# Patient Record
Sex: Female | Born: 1955
Health system: Southern US, Community
[De-identification: ages and names within clinical notes are randomized; demographics above are authoritative.]

## PROBLEM LIST (undated history)

## (undated) DIAGNOSIS — F419 Anxiety disorder, unspecified: Secondary | ICD-10-CM

## (undated) DIAGNOSIS — M1990 Unspecified osteoarthritis, unspecified site: Secondary | ICD-10-CM

## (undated) HISTORY — DX: Unspecified osteoarthritis, unspecified site: M19.90

## (undated) HISTORY — DX: Anxiety disorder, unspecified: F41.9

---

## 1997-07-23 ENCOUNTER — Other Ambulatory Visit: Admission: RE | Admit: 1997-07-23 | Discharge: 1997-07-23 | Payer: Self-pay | Admitting: Obstetrics and Gynecology

## 1998-09-06 ENCOUNTER — Other Ambulatory Visit: Admission: RE | Admit: 1998-09-06 | Discharge: 1998-09-06 | Payer: Self-pay | Admitting: *Deleted

## 1999-09-09 ENCOUNTER — Other Ambulatory Visit: Admission: RE | Admit: 1999-09-09 | Discharge: 1999-09-09 | Payer: Self-pay | Admitting: *Deleted

## 2000-11-21 ENCOUNTER — Other Ambulatory Visit: Admission: RE | Admit: 2000-11-21 | Discharge: 2000-11-21 | Payer: Self-pay | Admitting: Obstetrics and Gynecology

## 2000-12-17 ENCOUNTER — Encounter: Admission: RE | Admit: 2000-12-17 | Discharge: 2000-12-17 | Payer: Self-pay | Admitting: *Deleted

## 2000-12-17 ENCOUNTER — Encounter: Payer: Self-pay | Admitting: *Deleted

## 2002-01-21 ENCOUNTER — Encounter: Admission: RE | Admit: 2002-01-21 | Discharge: 2002-01-21 | Payer: Self-pay | Admitting: Obstetrics and Gynecology

## 2002-01-21 ENCOUNTER — Encounter: Payer: Self-pay | Admitting: Obstetrics and Gynecology

## 2002-02-03 ENCOUNTER — Other Ambulatory Visit: Admission: RE | Admit: 2002-02-03 | Discharge: 2002-02-03 | Payer: Self-pay | Admitting: Obstetrics and Gynecology

## 2003-02-04 ENCOUNTER — Encounter: Admission: RE | Admit: 2003-02-04 | Discharge: 2003-02-04 | Payer: Self-pay | Admitting: Obstetrics and Gynecology

## 2003-02-06 ENCOUNTER — Encounter: Admission: RE | Admit: 2003-02-06 | Discharge: 2003-02-06 | Payer: Self-pay | Admitting: Obstetrics and Gynecology

## 2003-02-10 ENCOUNTER — Other Ambulatory Visit: Admission: RE | Admit: 2003-02-10 | Discharge: 2003-02-10 | Payer: Self-pay | Admitting: Obstetrics and Gynecology

## 2004-02-09 ENCOUNTER — Ambulatory Visit (HOSPITAL_COMMUNITY): Admission: RE | Admit: 2004-02-09 | Discharge: 2004-02-09 | Payer: Self-pay | Admitting: Obstetrics and Gynecology

## 2005-04-07 ENCOUNTER — Encounter: Admission: RE | Admit: 2005-04-07 | Discharge: 2005-04-07 | Payer: Self-pay | Admitting: Obstetrics and Gynecology

## 2006-04-09 ENCOUNTER — Encounter: Admission: RE | Admit: 2006-04-09 | Discharge: 2006-04-09 | Payer: Self-pay | Admitting: Obstetrics and Gynecology

## 2007-05-14 ENCOUNTER — Encounter: Admission: RE | Admit: 2007-05-14 | Discharge: 2007-05-14 | Payer: Self-pay | Admitting: Obstetrics and Gynecology

## 2008-05-18 ENCOUNTER — Encounter: Admission: RE | Admit: 2008-05-18 | Discharge: 2008-05-18 | Payer: Self-pay | Admitting: Obstetrics and Gynecology

## 2009-06-04 ENCOUNTER — Encounter: Admission: RE | Admit: 2009-06-04 | Discharge: 2009-06-04 | Payer: Self-pay | Admitting: Obstetrics and Gynecology

## 2010-02-09 ENCOUNTER — Emergency Department (HOSPITAL_BASED_OUTPATIENT_CLINIC_OR_DEPARTMENT_OTHER)
Admission: EM | Admit: 2010-02-09 | Discharge: 2010-02-09 | Payer: Self-pay | Source: Home / Self Care | Admitting: Emergency Medicine

## 2010-02-14 LAB — BASIC METABOLIC PANEL
Calcium: 9.5 mg/dL (ref 8.4–10.5)
Chloride: 106 mEq/L (ref 96–112)
Creatinine, Ser: 0.6 mg/dL (ref 0.4–1.2)
GFR calc non Af Amer: >60 mL/min (ref >60)
Glucose, Bld: 121 mg/dL — ABNORMAL HIGH (ref 70–99)
Potassium: 4.6 mEq/L (ref 3.5–5.1)
Sodium: 141 mEq/L (ref 135–145)

## 2010-02-14 LAB — URINALYSIS, ROUTINE W REFLEX MICROSCOPIC
Nitrite: NEGATIVE
Protein, ur: NEGATIVE mg/dL
Specific Gravity, Urine: 1.015 (ref 1.005–1.030)
Urine Glucose, Fasting: NEGATIVE mg/dL
Urobilinogen, UA: 0.2 mg/dL (ref 0.0–1.0)
pH: 7.5 (ref 5.0–8.0)

## 2010-02-14 LAB — DIFFERENTIAL
Basophils Absolute: 0 10*3/uL (ref 0.0–0.1)
Eosinophils Absolute: 0 10*3/uL (ref 0.0–0.7)
Monocytes Absolute: 0.7 10*3/uL (ref 0.1–1.0)
Monocytes Relative: 5 % (ref 3–12)
Neutro Abs: 13 10*3/uL — ABNORMAL HIGH (ref 1.7–7.7)

## 2010-02-14 LAB — CBC: Hemoglobin: 16.2 g/dL — ABNORMAL HIGH (ref 12.0–15.0)

## 2010-05-09 ENCOUNTER — Other Ambulatory Visit: Payer: Self-pay | Admitting: Obstetrics and Gynecology

## 2010-05-09 DIAGNOSIS — Z1231 Encounter for screening mammogram for malignant neoplasm of breast: Secondary | ICD-10-CM

## 2010-06-07 ENCOUNTER — Ambulatory Visit
Admission: RE | Admit: 2010-06-07 | Discharge: 2010-06-07 | Disposition: A | Discharge status: Home / Self Care | Payer: Private Health Insurance - Indemnity | Source: Ambulatory Visit | Attending: Obstetrics and Gynecology | Admitting: Obstetrics and Gynecology

## 2010-06-07 DIAGNOSIS — Z1231 Encounter for screening mammogram for malignant neoplasm of breast: Secondary | ICD-10-CM

## 2010-08-11 ENCOUNTER — Other Ambulatory Visit: Payer: Self-pay | Admitting: Obstetrics and Gynecology

## 2010-08-11 DIAGNOSIS — Z780 Asymptomatic menopausal state: Secondary | ICD-10-CM

## 2010-08-26 ENCOUNTER — Ambulatory Visit
Admission: RE | Admit: 2010-08-26 | Discharge: 2010-08-26 | Disposition: A | Discharge status: Home / Self Care | Payer: Private Health Insurance - Indemnity | Source: Ambulatory Visit | Attending: Obstetrics and Gynecology | Admitting: Obstetrics and Gynecology

## 2010-08-26 DIAGNOSIS — Z780 Asymptomatic menopausal state: Secondary | ICD-10-CM

## 2011-05-01 ENCOUNTER — Other Ambulatory Visit: Payer: Self-pay | Admitting: Obstetrics and Gynecology

## 2011-05-01 DIAGNOSIS — Z1231 Encounter for screening mammogram for malignant neoplasm of breast: Secondary | ICD-10-CM

## 2011-06-08 ENCOUNTER — Ambulatory Visit
Admission: RE | Admit: 2011-06-08 | Discharge: 2011-06-08 | Disposition: A | Discharge status: Home / Self Care | Payer: Private Health Insurance - Indemnity | Source: Ambulatory Visit | Attending: Obstetrics and Gynecology | Admitting: Obstetrics and Gynecology

## 2011-06-08 DIAGNOSIS — Z1231 Encounter for screening mammogram for malignant neoplasm of breast: Secondary | ICD-10-CM

## 2012-06-04 ENCOUNTER — Other Ambulatory Visit: Payer: Self-pay

## 2012-06-04 DIAGNOSIS — Z1231 Encounter for screening mammogram for malignant neoplasm of breast: Secondary | ICD-10-CM

## 2012-07-09 ENCOUNTER — Ambulatory Visit
Admission: RE | Admit: 2012-07-09 | Discharge: 2012-07-09 | Disposition: A | Discharge status: Home / Self Care | Payer: BC Managed Care – PPO | Source: Ambulatory Visit

## 2012-07-09 DIAGNOSIS — Z1231 Encounter for screening mammogram for malignant neoplasm of breast: Secondary | ICD-10-CM

## 2012-11-29 ENCOUNTER — Other Ambulatory Visit: Payer: Self-pay | Admitting: Obstetrics and Gynecology

## 2012-11-29 DIAGNOSIS — M8580 Other specified disorders of bone density and structure, unspecified site: Secondary | ICD-10-CM

## 2012-12-23 ENCOUNTER — Ambulatory Visit
Admission: RE | Admit: 2012-12-23 | Discharge: 2012-12-23 | Disposition: A | Discharge status: Home / Self Care | Payer: BC Managed Care – PPO | Source: Ambulatory Visit | Attending: Obstetrics and Gynecology | Admitting: Obstetrics and Gynecology

## 2012-12-23 DIAGNOSIS — M8580 Other specified disorders of bone density and structure, unspecified site: Secondary | ICD-10-CM

## 2013-07-14 ENCOUNTER — Other Ambulatory Visit: Payer: Self-pay

## 2013-07-14 DIAGNOSIS — Z1231 Encounter for screening mammogram for malignant neoplasm of breast: Secondary | ICD-10-CM

## 2013-07-23 ENCOUNTER — Ambulatory Visit
Admission: RE | Admit: 2013-07-23 | Discharge: 2013-07-23 | Disposition: A | Discharge status: Home / Self Care | Payer: BC Managed Care – PPO | Source: Ambulatory Visit

## 2013-07-23 DIAGNOSIS — Z1231 Encounter for screening mammogram for malignant neoplasm of breast: Secondary | ICD-10-CM

## 2014-07-15 ENCOUNTER — Other Ambulatory Visit: Payer: Self-pay

## 2014-07-15 DIAGNOSIS — Z1231 Encounter for screening mammogram for malignant neoplasm of breast: Secondary | ICD-10-CM

## 2014-08-24 ENCOUNTER — Ambulatory Visit: Payer: Self-pay

## 2014-10-02 ENCOUNTER — Ambulatory Visit
Admission: RE | Admit: 2014-10-02 | Discharge: 2014-10-02 | Disposition: A | Discharge status: Home / Self Care | Payer: BLUE CROSS/BLUE SHIELD | Source: Ambulatory Visit

## 2014-10-02 DIAGNOSIS — Z1231 Encounter for screening mammogram for malignant neoplasm of breast: Secondary | ICD-10-CM

## 2015-01-28 ENCOUNTER — Other Ambulatory Visit: Payer: Self-pay | Admitting: Obstetrics and Gynecology

## 2015-01-28 DIAGNOSIS — M8580 Other specified disorders of bone density and structure, unspecified site: Secondary | ICD-10-CM

## 2015-02-25 ENCOUNTER — Ambulatory Visit
Admission: RE | Admit: 2015-02-25 | Discharge: 2015-02-25 | Disposition: A | Discharge status: Home / Self Care | Payer: BLUE CROSS/BLUE SHIELD | Source: Ambulatory Visit | Attending: Obstetrics and Gynecology | Admitting: Obstetrics and Gynecology

## 2015-02-25 DIAGNOSIS — M8580 Other specified disorders of bone density and structure, unspecified site: Secondary | ICD-10-CM

## 2015-09-07 ENCOUNTER — Other Ambulatory Visit: Payer: Self-pay | Admitting: Obstetrics and Gynecology

## 2015-09-07 DIAGNOSIS — Z1231 Encounter for screening mammogram for malignant neoplasm of breast: Secondary | ICD-10-CM

## 2015-10-06 ENCOUNTER — Ambulatory Visit
Admission: RE | Admit: 2015-10-06 | Discharge: 2015-10-06 | Disposition: A | Discharge status: Home / Self Care | Payer: BLUE CROSS/BLUE SHIELD | Source: Ambulatory Visit | Attending: Obstetrics and Gynecology | Admitting: Obstetrics and Gynecology

## 2015-10-06 DIAGNOSIS — Z1231 Encounter for screening mammogram for malignant neoplasm of breast: Secondary | ICD-10-CM | POA: Diagnosis not present

## 2015-12-21 DIAGNOSIS — M8580 Other specified disorders of bone density and structure, unspecified site: Secondary | ICD-10-CM | POA: Diagnosis not present

## 2015-12-21 DIAGNOSIS — Z0000 Encounter for general adult medical examination without abnormal findings: Secondary | ICD-10-CM | POA: Diagnosis not present

## 2015-12-21 DIAGNOSIS — E559 Vitamin D deficiency, unspecified: Secondary | ICD-10-CM | POA: Diagnosis not present

## 2015-12-23 DIAGNOSIS — Z23 Encounter for immunization: Secondary | ICD-10-CM | POA: Diagnosis not present

## 2015-12-23 DIAGNOSIS — M8580 Other specified disorders of bone density and structure, unspecified site: Secondary | ICD-10-CM | POA: Diagnosis not present

## 2015-12-23 DIAGNOSIS — Z0000 Encounter for general adult medical examination without abnormal findings: Secondary | ICD-10-CM | POA: Diagnosis not present

## 2015-12-23 DIAGNOSIS — K219 Gastro-esophageal reflux disease without esophagitis: Secondary | ICD-10-CM | POA: Diagnosis not present

## 2015-12-23 DIAGNOSIS — E559 Vitamin D deficiency, unspecified: Secondary | ICD-10-CM | POA: Diagnosis not present

## 2015-12-23 DIAGNOSIS — G4700 Insomnia, unspecified: Secondary | ICD-10-CM | POA: Diagnosis not present

## 2015-12-26 DIAGNOSIS — E559 Vitamin D deficiency, unspecified: Secondary | ICD-10-CM | POA: Insufficient documentation

## 2015-12-26 DIAGNOSIS — K219 Gastro-esophageal reflux disease without esophagitis: Secondary | ICD-10-CM | POA: Insufficient documentation

## 2015-12-26 DIAGNOSIS — R319 Hematuria, unspecified: Secondary | ICD-10-CM | POA: Insufficient documentation

## 2015-12-26 DIAGNOSIS — G4700 Insomnia, unspecified: Secondary | ICD-10-CM | POA: Insufficient documentation

## 2015-12-27 DIAGNOSIS — Z23 Encounter for immunization: Secondary | ICD-10-CM | POA: Diagnosis not present

## 2016-01-04 DIAGNOSIS — K654 Sclerosing mesenteritis: Secondary | ICD-10-CM | POA: Diagnosis not present

## 2016-01-04 DIAGNOSIS — R938 Abnormal findings on diagnostic imaging of other specified body structures: Secondary | ICD-10-CM | POA: Diagnosis not present

## 2016-03-09 DIAGNOSIS — Z01419 Encounter for gynecological examination (general) (routine) without abnormal findings: Secondary | ICD-10-CM | POA: Diagnosis not present

## 2016-03-09 DIAGNOSIS — Z6826 Body mass index (BMI) 26.0-26.9, adult: Secondary | ICD-10-CM | POA: Diagnosis not present

## 2016-06-01 DIAGNOSIS — G4700 Insomnia, unspecified: Secondary | ICD-10-CM | POA: Diagnosis not present

## 2016-07-06 DIAGNOSIS — M5416 Radiculopathy, lumbar region: Secondary | ICD-10-CM | POA: Diagnosis not present

## 2016-07-06 DIAGNOSIS — M1712 Unilateral primary osteoarthritis, left knee: Secondary | ICD-10-CM | POA: Diagnosis not present

## 2016-07-21 DIAGNOSIS — H6121 Impacted cerumen, right ear: Secondary | ICD-10-CM | POA: Diagnosis not present

## 2016-09-21 ENCOUNTER — Other Ambulatory Visit: Payer: Self-pay | Admitting: Obstetrics and Gynecology

## 2016-09-21 ENCOUNTER — Other Ambulatory Visit: Payer: Self-pay | Admitting: Family Medicine

## 2016-09-21 DIAGNOSIS — Z1231 Encounter for screening mammogram for malignant neoplasm of breast: Secondary | ICD-10-CM

## 2016-09-22 ENCOUNTER — Other Ambulatory Visit: Payer: Self-pay | Admitting: Obstetrics and Gynecology

## 2016-09-22 DIAGNOSIS — Z1231 Encounter for screening mammogram for malignant neoplasm of breast: Secondary | ICD-10-CM

## 2016-10-06 ENCOUNTER — Ambulatory Visit: Payer: BLUE CROSS/BLUE SHIELD

## 2016-10-24 ENCOUNTER — Ambulatory Visit: Payer: BLUE CROSS/BLUE SHIELD

## 2016-11-14 ENCOUNTER — Ambulatory Visit
Admission: RE | Admit: 2016-11-14 | Discharge: 2016-11-14 | Disposition: A | Discharge status: Home / Self Care | Payer: BLUE CROSS/BLUE SHIELD | Source: Ambulatory Visit | Attending: Obstetrics and Gynecology | Admitting: Obstetrics and Gynecology

## 2016-11-14 DIAGNOSIS — G4700 Insomnia, unspecified: Secondary | ICD-10-CM | POA: Diagnosis not present

## 2016-11-14 DIAGNOSIS — Z1231 Encounter for screening mammogram for malignant neoplasm of breast: Secondary | ICD-10-CM

## 2016-11-14 DIAGNOSIS — Z23 Encounter for immunization: Secondary | ICD-10-CM | POA: Diagnosis not present

## 2016-12-08 DIAGNOSIS — M546 Pain in thoracic spine: Secondary | ICD-10-CM | POA: Diagnosis not present

## 2016-12-08 DIAGNOSIS — M5136 Other intervertebral disc degeneration, lumbar region: Secondary | ICD-10-CM | POA: Diagnosis not present

## 2016-12-08 DIAGNOSIS — M47816 Spondylosis without myelopathy or radiculopathy, lumbar region: Secondary | ICD-10-CM | POA: Diagnosis not present

## 2016-12-08 DIAGNOSIS — M4155 Other secondary scoliosis, thoracolumbar region: Secondary | ICD-10-CM | POA: Diagnosis not present

## 2016-12-08 DIAGNOSIS — M5440 Lumbago with sciatica, unspecified side: Secondary | ICD-10-CM | POA: Diagnosis not present

## 2016-12-08 DIAGNOSIS — M549 Dorsalgia, unspecified: Secondary | ICD-10-CM | POA: Diagnosis not present

## 2016-12-21 DIAGNOSIS — M7632 Iliotibial band syndrome, left leg: Secondary | ICD-10-CM | POA: Diagnosis not present

## 2016-12-26 DIAGNOSIS — Z0000 Encounter for general adult medical examination without abnormal findings: Secondary | ICD-10-CM | POA: Diagnosis not present

## 2016-12-26 DIAGNOSIS — G4700 Insomnia, unspecified: Secondary | ICD-10-CM | POA: Diagnosis not present

## 2016-12-26 DIAGNOSIS — K219 Gastro-esophageal reflux disease without esophagitis: Secondary | ICD-10-CM | POA: Diagnosis not present

## 2016-12-26 DIAGNOSIS — E559 Vitamin D deficiency, unspecified: Secondary | ICD-10-CM | POA: Diagnosis not present

## 2016-12-26 DIAGNOSIS — M8580 Other specified disorders of bone density and structure, unspecified site: Secondary | ICD-10-CM | POA: Diagnosis not present

## 2017-03-15 DIAGNOSIS — Z6826 Body mass index (BMI) 26.0-26.9, adult: Secondary | ICD-10-CM | POA: Diagnosis not present

## 2017-03-15 DIAGNOSIS — Z01419 Encounter for gynecological examination (general) (routine) without abnormal findings: Secondary | ICD-10-CM | POA: Diagnosis not present

## 2017-03-22 ENCOUNTER — Other Ambulatory Visit: Payer: Self-pay | Admitting: Obstetrics and Gynecology

## 2017-03-22 DIAGNOSIS — M8580 Other specified disorders of bone density and structure, unspecified site: Secondary | ICD-10-CM

## 2017-04-24 DIAGNOSIS — M25562 Pain in left knee: Secondary | ICD-10-CM | POA: Diagnosis not present

## 2017-04-24 DIAGNOSIS — G4700 Insomnia, unspecified: Secondary | ICD-10-CM | POA: Diagnosis not present

## 2017-04-24 DIAGNOSIS — M79644 Pain in right finger(s): Secondary | ICD-10-CM | POA: Diagnosis not present

## 2017-04-24 DIAGNOSIS — M542 Cervicalgia: Secondary | ICD-10-CM | POA: Diagnosis not present

## 2017-05-03 ENCOUNTER — Ambulatory Visit
Admission: RE | Admit: 2017-05-03 | Discharge: 2017-05-03 | Disposition: A | Discharge status: Home / Self Care | Payer: BLUE CROSS/BLUE SHIELD | Source: Ambulatory Visit | Attending: Obstetrics and Gynecology | Admitting: Obstetrics and Gynecology

## 2017-05-03 DIAGNOSIS — M8589 Other specified disorders of bone density and structure, multiple sites: Secondary | ICD-10-CM | POA: Diagnosis not present

## 2017-05-03 DIAGNOSIS — M8580 Other specified disorders of bone density and structure, unspecified site: Secondary | ICD-10-CM

## 2017-05-03 DIAGNOSIS — Z780 Asymptomatic menopausal state: Secondary | ICD-10-CM | POA: Diagnosis not present

## 2017-06-06 DIAGNOSIS — K228 Other specified diseases of esophagus: Secondary | ICD-10-CM | POA: Diagnosis not present

## 2017-06-06 DIAGNOSIS — M810 Age-related osteoporosis without current pathological fracture: Secondary | ICD-10-CM | POA: Diagnosis not present

## 2017-10-18 ENCOUNTER — Other Ambulatory Visit: Payer: Self-pay | Admitting: Obstetrics and Gynecology

## 2017-10-18 DIAGNOSIS — G8929 Other chronic pain: Secondary | ICD-10-CM | POA: Diagnosis not present

## 2017-10-18 DIAGNOSIS — Z23 Encounter for immunization: Secondary | ICD-10-CM | POA: Diagnosis not present

## 2017-10-18 DIAGNOSIS — Z1231 Encounter for screening mammogram for malignant neoplasm of breast: Secondary | ICD-10-CM

## 2017-10-18 DIAGNOSIS — G4700 Insomnia, unspecified: Secondary | ICD-10-CM | POA: Diagnosis not present

## 2017-11-29 ENCOUNTER — Ambulatory Visit
Admission: RE | Admit: 2017-11-29 | Discharge: 2017-11-29 | Disposition: A | Discharge status: Home / Self Care | Payer: BLUE CROSS/BLUE SHIELD | Source: Ambulatory Visit | Attending: Obstetrics and Gynecology | Admitting: Obstetrics and Gynecology

## 2017-11-29 DIAGNOSIS — Z1231 Encounter for screening mammogram for malignant neoplasm of breast: Secondary | ICD-10-CM

## 2018-08-20 DIAGNOSIS — Z791 Long term (current) use of non-steroidal anti-inflammatories (NSAID): Secondary | ICD-10-CM | POA: Insufficient documentation

## 2018-11-06 ENCOUNTER — Other Ambulatory Visit: Payer: Self-pay | Admitting: Obstetrics and Gynecology

## 2018-11-06 DIAGNOSIS — Z1231 Encounter for screening mammogram for malignant neoplasm of breast: Secondary | ICD-10-CM

## 2018-12-10 ENCOUNTER — Other Ambulatory Visit: Payer: Self-pay

## 2018-12-10 ENCOUNTER — Ambulatory Visit
Admission: RE | Admit: 2018-12-10 | Discharge: 2018-12-10 | Disposition: A | Discharge status: Home / Self Care | Payer: BLUE CROSS/BLUE SHIELD | Source: Ambulatory Visit | Attending: Obstetrics and Gynecology | Admitting: Obstetrics and Gynecology

## 2018-12-10 DIAGNOSIS — Z1231 Encounter for screening mammogram for malignant neoplasm of breast: Secondary | ICD-10-CM | POA: Diagnosis not present

## 2019-02-15 DIAGNOSIS — Z79899 Other long term (current) drug therapy: Secondary | ICD-10-CM | POA: Diagnosis not present

## 2019-02-15 DIAGNOSIS — R739 Hyperglycemia, unspecified: Secondary | ICD-10-CM | POA: Diagnosis not present

## 2019-02-15 DIAGNOSIS — E559 Vitamin D deficiency, unspecified: Secondary | ICD-10-CM | POA: Diagnosis not present

## 2019-02-20 DIAGNOSIS — Z7289 Other problems related to lifestyle: Secondary | ICD-10-CM | POA: Diagnosis not present

## 2019-02-20 DIAGNOSIS — G4700 Insomnia, unspecified: Secondary | ICD-10-CM | POA: Diagnosis not present

## 2019-02-20 DIAGNOSIS — Z791 Long term (current) use of non-steroidal anti-inflammatories (NSAID): Secondary | ICD-10-CM | POA: Diagnosis not present

## 2019-02-20 DIAGNOSIS — G8929 Other chronic pain: Secondary | ICD-10-CM | POA: Diagnosis not present

## 2019-03-18 DIAGNOSIS — R5383 Other fatigue: Secondary | ICD-10-CM | POA: Diagnosis not present

## 2019-03-18 DIAGNOSIS — J029 Acute pharyngitis, unspecified: Secondary | ICD-10-CM | POA: Diagnosis not present

## 2019-03-21 DIAGNOSIS — Z20828 Contact with and (suspected) exposure to other viral communicable diseases: Secondary | ICD-10-CM | POA: Diagnosis not present

## 2019-03-25 DIAGNOSIS — Z01419 Encounter for gynecological examination (general) (routine) without abnormal findings: Secondary | ICD-10-CM | POA: Diagnosis not present

## 2019-03-25 DIAGNOSIS — Z6825 Body mass index (BMI) 25.0-25.9, adult: Secondary | ICD-10-CM | POA: Diagnosis not present

## 2019-03-31 ENCOUNTER — Other Ambulatory Visit: Payer: Self-pay | Admitting: Obstetrics and Gynecology

## 2019-03-31 DIAGNOSIS — M810 Age-related osteoporosis without current pathological fracture: Secondary | ICD-10-CM

## 2019-05-15 DIAGNOSIS — N949 Unspecified condition associated with female genital organs and menstrual cycle: Secondary | ICD-10-CM | POA: Diagnosis not present

## 2019-05-15 DIAGNOSIS — N952 Postmenopausal atrophic vaginitis: Secondary | ICD-10-CM | POA: Diagnosis not present

## 2019-05-15 DIAGNOSIS — R102 Pelvic and perineal pain: Secondary | ICD-10-CM | POA: Diagnosis not present

## 2019-05-15 DIAGNOSIS — B373 Candidiasis of vulva and vagina: Secondary | ICD-10-CM | POA: Diagnosis not present

## 2019-05-19 DIAGNOSIS — L900 Lichen sclerosus et atrophicus: Secondary | ICD-10-CM | POA: Diagnosis not present

## 2019-05-19 DIAGNOSIS — N898 Other specified noninflammatory disorders of vagina: Secondary | ICD-10-CM | POA: Diagnosis not present

## 2019-06-03 DIAGNOSIS — L900 Lichen sclerosus et atrophicus: Secondary | ICD-10-CM | POA: Diagnosis not present

## 2019-06-10 DIAGNOSIS — L659 Nonscarring hair loss, unspecified: Secondary | ICD-10-CM | POA: Diagnosis not present

## 2019-06-10 DIAGNOSIS — R5383 Other fatigue: Secondary | ICD-10-CM | POA: Diagnosis not present

## 2019-06-17 ENCOUNTER — Ambulatory Visit
Admission: RE | Admit: 2019-06-17 | Discharge: 2019-06-17 | Disposition: A | Discharge status: Home / Self Care | Payer: BC Managed Care – PPO | Source: Ambulatory Visit | Attending: Obstetrics and Gynecology | Admitting: Obstetrics and Gynecology

## 2019-06-17 ENCOUNTER — Other Ambulatory Visit: Payer: Self-pay

## 2019-06-17 DIAGNOSIS — M810 Age-related osteoporosis without current pathological fracture: Secondary | ICD-10-CM

## 2019-06-17 DIAGNOSIS — M85832 Other specified disorders of bone density and structure, left forearm: Secondary | ICD-10-CM | POA: Diagnosis not present

## 2019-06-17 DIAGNOSIS — Z780 Asymptomatic menopausal state: Secondary | ICD-10-CM | POA: Diagnosis not present

## 2019-06-20 DIAGNOSIS — M545 Low back pain: Secondary | ICD-10-CM | POA: Diagnosis not present

## 2019-06-20 DIAGNOSIS — M5431 Sciatica, right side: Secondary | ICD-10-CM | POA: Diagnosis not present

## 2019-06-30 DIAGNOSIS — L900 Lichen sclerosus et atrophicus: Secondary | ICD-10-CM | POA: Diagnosis not present

## 2019-07-11 DIAGNOSIS — L900 Lichen sclerosus et atrophicus: Secondary | ICD-10-CM | POA: Diagnosis not present

## 2019-07-17 DIAGNOSIS — L900 Lichen sclerosus et atrophicus: Secondary | ICD-10-CM | POA: Insufficient documentation

## 2019-08-01 DIAGNOSIS — M1712 Unilateral primary osteoarthritis, left knee: Secondary | ICD-10-CM | POA: Diagnosis not present

## 2019-08-01 DIAGNOSIS — M25562 Pain in left knee: Secondary | ICD-10-CM | POA: Diagnosis not present

## 2019-08-01 DIAGNOSIS — M545 Low back pain: Secondary | ICD-10-CM | POA: Diagnosis not present

## 2019-08-08 DIAGNOSIS — M5136 Other intervertebral disc degeneration, lumbar region: Secondary | ICD-10-CM | POA: Diagnosis not present

## 2019-08-08 DIAGNOSIS — M47816 Spondylosis without myelopathy or radiculopathy, lumbar region: Secondary | ICD-10-CM | POA: Diagnosis not present

## 2019-08-14 DIAGNOSIS — M810 Age-related osteoporosis without current pathological fracture: Secondary | ICD-10-CM | POA: Diagnosis not present

## 2019-08-14 DIAGNOSIS — Z791 Long term (current) use of non-steroidal anti-inflammatories (NSAID): Secondary | ICD-10-CM | POA: Diagnosis not present

## 2019-08-14 DIAGNOSIS — G8929 Other chronic pain: Secondary | ICD-10-CM | POA: Diagnosis not present

## 2019-08-14 DIAGNOSIS — G4700 Insomnia, unspecified: Secondary | ICD-10-CM | POA: Diagnosis not present

## 2019-08-22 DIAGNOSIS — M419 Scoliosis, unspecified: Secondary | ICD-10-CM | POA: Diagnosis not present

## 2019-08-22 DIAGNOSIS — M5116 Intervertebral disc disorders with radiculopathy, lumbar region: Secondary | ICD-10-CM | POA: Diagnosis not present

## 2019-09-03 DIAGNOSIS — M4726 Other spondylosis with radiculopathy, lumbar region: Secondary | ICD-10-CM | POA: Diagnosis not present

## 2019-09-03 DIAGNOSIS — M5136 Other intervertebral disc degeneration, lumbar region: Secondary | ICD-10-CM | POA: Diagnosis not present

## 2019-09-12 DIAGNOSIS — L900 Lichen sclerosus et atrophicus: Secondary | ICD-10-CM | POA: Diagnosis not present

## 2019-09-18 DIAGNOSIS — M7061 Trochanteric bursitis, right hip: Secondary | ICD-10-CM | POA: Diagnosis not present

## 2019-09-18 DIAGNOSIS — M7631 Iliotibial band syndrome, right leg: Secondary | ICD-10-CM | POA: Diagnosis not present

## 2019-09-23 DIAGNOSIS — M763 Iliotibial band syndrome: Secondary | ICD-10-CM | POA: Diagnosis not present

## 2019-10-02 DIAGNOSIS — M763 Iliotibial band syndrome: Secondary | ICD-10-CM | POA: Diagnosis not present

## 2019-10-10 DIAGNOSIS — M763 Iliotibial band syndrome: Secondary | ICD-10-CM | POA: Diagnosis not present

## 2019-10-15 DIAGNOSIS — M7061 Trochanteric bursitis, right hip: Secondary | ICD-10-CM | POA: Diagnosis not present

## 2019-11-13 DIAGNOSIS — M19041 Primary osteoarthritis, right hand: Secondary | ICD-10-CM | POA: Diagnosis not present

## 2019-11-13 DIAGNOSIS — M79642 Pain in left hand: Secondary | ICD-10-CM | POA: Diagnosis not present

## 2019-11-13 DIAGNOSIS — M19042 Primary osteoarthritis, left hand: Secondary | ICD-10-CM | POA: Diagnosis not present

## 2019-11-13 DIAGNOSIS — M25551 Pain in right hip: Secondary | ICD-10-CM | POA: Diagnosis not present

## 2019-11-13 DIAGNOSIS — M810 Age-related osteoporosis without current pathological fracture: Secondary | ICD-10-CM | POA: Diagnosis not present

## 2019-11-13 DIAGNOSIS — M7631 Iliotibial band syndrome, right leg: Secondary | ICD-10-CM | POA: Diagnosis not present

## 2019-11-13 DIAGNOSIS — M79641 Pain in right hand: Secondary | ICD-10-CM | POA: Diagnosis not present

## 2019-11-13 DIAGNOSIS — M79604 Pain in right leg: Secondary | ICD-10-CM | POA: Diagnosis not present

## 2019-11-14 DIAGNOSIS — Z9151 Personal history of suicidal behavior: Secondary | ICD-10-CM | POA: Insufficient documentation

## 2019-11-14 DIAGNOSIS — F331 Major depressive disorder, recurrent, moderate: Secondary | ICD-10-CM | POA: Insufficient documentation

## 2019-12-03 ENCOUNTER — Other Ambulatory Visit: Payer: Self-pay | Admitting: Obstetrics and Gynecology

## 2019-12-03 ENCOUNTER — Ambulatory Visit: Payer: BC Managed Care – PPO | Admitting: Family Medicine

## 2019-12-03 ENCOUNTER — Encounter: Payer: Self-pay | Admitting: Family Medicine

## 2019-12-03 ENCOUNTER — Telehealth: Payer: Self-pay | Admitting: Family Medicine

## 2019-12-03 ENCOUNTER — Other Ambulatory Visit: Payer: Self-pay

## 2019-12-03 DIAGNOSIS — Z1231 Encounter for screening mammogram for malignant neoplasm of breast: Secondary | ICD-10-CM

## 2019-12-03 DIAGNOSIS — M1990 Unspecified osteoarthritis, unspecified site: Secondary | ICD-10-CM | POA: Insufficient documentation

## 2019-12-03 DIAGNOSIS — M1712 Unilateral primary osteoarthritis, left knee: Secondary | ICD-10-CM | POA: Diagnosis not present

## 2019-12-03 DIAGNOSIS — M1610 Unilateral primary osteoarthritis, unspecified hip: Secondary | ICD-10-CM | POA: Insufficient documentation

## 2019-12-03 DIAGNOSIS — M419 Scoliosis, unspecified: Secondary | ICD-10-CM | POA: Insufficient documentation

## 2019-12-03 DIAGNOSIS — K589 Irritable bowel syndrome without diarrhea: Secondary | ICD-10-CM | POA: Diagnosis not present

## 2019-12-03 DIAGNOSIS — M479 Spondylosis, unspecified: Secondary | ICD-10-CM | POA: Insufficient documentation

## 2019-12-03 DIAGNOSIS — M25551 Pain in right hip: Secondary | ICD-10-CM

## 2019-12-03 NOTE — Progress Notes (Signed)
Office Visit Note   Patient: Carla Aguilar           Date of Birth: 1955-04-21           MRN: [id number] Visit Date: 12/03/2019 Requested by: Loyal Jacobson, MD [address] [place],  [place] [address] PCP: Lavada Mesi, MD  Subjective: Chief Complaint  Patient presents with  . Right Thigh - Pain    Was referred by [place] McAmmond. Pain in the lateral/posterior hip and occasionally in the groin. Has been seen at [facility] - had an injection in the back and in the right hip bursa - no help.    HPI: She is here at the request of [place] McCammon for right hip pain.  Symptoms started months ago, no definite injury.  She has been struggling with left knee osteoarthritis.  She has done well with gel injections over the years, but she does not think her insurance covers them anymore.  She notes that when she gets gel injections, she has a reaction for about 2 weeks which results in swelling of the knee requiring aspiration but once that period is over, she responds very well and sometimes gets relief for a couple years.  She thinks that she favoring her knee has contributed to her low back and hip pain.  In addition, she has developed lichen sclerosis which makes it very painful for her to sit.  She is constantly having to shift positions and this has contributed to her pains as well.  She has been to another orthopedic clinic and had x-rays and MRI scan of the lumbar spine which did not show any compressive lesions but does show some degenerative change.  She underwent lumbar injection which did not seem to help much with her pain.  She then had a greater trochanter injection but that did not help much either.  She has been working with [place] for physical therapy treatments to her IT band and that has helped a lot, but she continues to have a pain more proximally and laterally that does not seem to be resolving.  She notes that she has struggled with irritable bowel syndrome since she  was about 19.  She has had other pains in various joints of her body.  She had rheumatology labs drawn recently which were negative.  She has had worsening of bone density over the past 2 years and is now in osteoporosis range in her hip.               ROS:   All other systems were reviewed and are negative.  Objective: Vital Signs: There were no vitals taken for this visit.  Physical Exam:  General:  Alert and oriented, in no acute distress. Pulm:  Breathing unlabored. Psy:  Normal mood, congruent affect. Skin: No rash today. Right hip: She has good range of motion with passive flexion and internal/external rotation.  She is tender to palpation near the gluteus medius tendon on the posterior lateral greater trochanter.  Negative straight leg raise, lower extremity strength and reflexes are normal. Left knee: Trace effusion with no warmth.  Tender on the medial and lateral joint lines.   Imaging: No results found.  Assessment & Plan: 1.  Chronic right hip pain, suspect gluteus medius tendinopathy. -Discussed options with her and elected to proceed with MRI scan to confirm the diagnosis.  Depending on the results, could try dextrose prolotherapy injection.  2.  Left knee osteoarthritis -When she returns to  discuss her hip MRI results, we will contemplate dextrose prolotherapy injection.  If that does not help, we will also request approval for gel injections.  3.  Irritable bowel syndrome -I sent her some information about trying a food elimination diet.  Depending on her response, could contemplate [place] stool testing.     Procedures: No procedures performed  No notes on file     PMFS History: Patient Active Problem List   Diagnosis Date Noted  . Arthritis 12/03/2019  . Degenerative joint disease of low back 12/03/2019  . Hip arthritis 12/03/2019  . Scoliosis 12/03/2019  . Moderate episode of recurrent major depressive disorder (HCC) 11/14/2019  . History of suicide  attempt 11/14/2019  . Other chronic pain 08/14/2019  . Osteoporosis 08/14/2019  . Lichen sclerosus 07/17/2019  . Long term (current) use of non-steroidal anti-inflammatories (nsaid) 08/20/2018  . Gastroesophageal reflux disease 12/26/2015  . Hematuria 12/26/2015  . Vitamin D deficiency 12/26/2015  . Insomnia 12/26/2015   History reviewed. No pertinent past medical history.  History reviewed. No pertinent family history.  History reviewed. No pertinent surgical history. Social History   Occupational History  . Not on file  Tobacco Use  . Smoking status: Not on file  Substance and Sexual Activity  . Alcohol use: Not on file  . Drug use: Not on file  . Sexual activity: Not on file

## 2019-12-03 NOTE — Telephone Encounter (Signed)
Please request approval for gel injections for left knee OA.

## 2019-12-05 ENCOUNTER — Encounter: Payer: Self-pay | Admitting: Family Medicine

## 2019-12-05 NOTE — Telephone Encounter (Signed)
Submitted for VOB for Synvisc One-left knee 

## 2019-12-08 ENCOUNTER — Telehealth: Payer: Self-pay

## 2019-12-08 NOTE — Telephone Encounter (Signed)
FYI   Per Durolane Rep.........Marland KitchenEffective 12/24/2015, Intra articular Hyaluronan injections of the knee are considered NOT medically necessary and will no longer be covered with [employer]. Administrations 20610/20611 are also not Covered.

## 2019-12-08 NOTE — Telephone Encounter (Signed)
Per Synvisc Rep, pt's insurance doesn't cover Synvisc. I have submitted for VOB for Durolane to see if this is covered.

## 2019-12-08 NOTE — Telephone Encounter (Signed)
We can tell her that there is a hyaluronic acid injection product that is a series of 3 injections, and cash price is about $85 each syringe, if she's interested in that.

## 2019-12-09 NOTE — Telephone Encounter (Signed)
Holding this message until Leotis Shames checks with Drinda Butts and Toniann Fail about using the Trivisc injections (new for this office).

## 2019-12-10 ENCOUNTER — Telehealth: Payer: Self-pay | Admitting: Family Medicine

## 2019-12-10 NOTE — Telephone Encounter (Signed)
Pt called stating she is claustrophobic and would like for [place] to send her MRI to a facility that has the ability to do an open MRI. Pt would like a CB when we have found her so we can update her  [phone]

## 2019-12-10 NOTE — Telephone Encounter (Signed)
I called and spoke with patient, she states that she spoke with someone @ [facility] and she was told that they do not have an open unit there just an extra large unit.  Is there somewhere else she can go?

## 2019-12-11 NOTE — Telephone Encounter (Signed)
Pt aware order has been sent to  [facility]

## 2019-12-11 NOTE — Telephone Encounter (Signed)
Spoke to pt and she is aware will be sending her to [facility]

## 2019-12-12 ENCOUNTER — Other Ambulatory Visit: Payer: Self-pay

## 2019-12-12 ENCOUNTER — Ambulatory Visit
Admission: RE | Admit: 2019-12-12 | Discharge: 2019-12-12 | Disposition: A | Discharge status: Home / Self Care | Payer: BC Managed Care – PPO | Source: Ambulatory Visit

## 2019-12-12 DIAGNOSIS — Z1231 Encounter for screening mammogram for malignant neoplasm of breast: Secondary | ICD-10-CM

## 2019-12-12 NOTE — Telephone Encounter (Signed)
I called the patient to see if she would like to pursue getting this series of 3 injections (Trivisc), requiring OOP cost of $250. She said she would like to wait and see how the prolotherapy injections work first, and then let us know if she would like to go with the Trivisc.

## 2019-12-26 DIAGNOSIS — N94819 Vulvodynia, unspecified: Secondary | ICD-10-CM | POA: Diagnosis not present

## 2019-12-26 DIAGNOSIS — L900 Lichen sclerosus et atrophicus: Secondary | ICD-10-CM | POA: Diagnosis not present

## 2020-01-01 DIAGNOSIS — N94819 Vulvodynia, unspecified: Secondary | ICD-10-CM | POA: Insufficient documentation

## 2020-01-02 ENCOUNTER — Telehealth: Payer: Self-pay | Admitting: Family Medicine

## 2020-01-02 DIAGNOSIS — M24851 Other specific joint derangements of right hip, not elsewhere classified: Secondary | ICD-10-CM | POA: Diagnosis not present

## 2020-01-02 DIAGNOSIS — S73191A Other sprain of right hip, initial encounter: Secondary | ICD-10-CM | POA: Diagnosis not present

## 2020-01-02 NOTE — Telephone Encounter (Signed)
MRI report for right hip is notable for a nondisplaced tear of the right hip anterior labrum cartilage.  There is also calcific tendinitis of the gluteus medius/minimus tendons.

## 2020-01-05 ENCOUNTER — Ambulatory Visit: Payer: BC Managed Care – PPO | Admitting: Family Medicine

## 2020-01-05 ENCOUNTER — Ambulatory Visit (INDEPENDENT_AMBULATORY_CARE_PROVIDER_SITE_OTHER): Payer: BC Managed Care – PPO | Admitting: Family Medicine

## 2020-01-05 ENCOUNTER — Encounter: Payer: Self-pay | Admitting: Family Medicine

## 2020-01-05 ENCOUNTER — Other Ambulatory Visit: Payer: Self-pay

## 2020-01-05 DIAGNOSIS — M25551 Pain in right hip: Secondary | ICD-10-CM

## 2020-01-05 DIAGNOSIS — S73191D Other sprain of right hip, subsequent encounter: Secondary | ICD-10-CM

## 2020-01-05 DIAGNOSIS — M1712 Unilateral primary osteoarthritis, left knee: Secondary | ICD-10-CM | POA: Diagnosis not present

## 2020-01-05 NOTE — Progress Notes (Signed)
   Office Visit Note   Patient: Carla Aguilar           Date of Birth: 1955/12/27           MRN: [id number] Visit Date: 01/05/2020 Requested by: Lavada Mesi, MD [address] [place],  [place] [address] PCP: Lavada Mesi, MD  Subjective: Chief Complaint  Patient presents with  . Right Hip - Pain, Follow-up    S/p MRI - brought the cd    HPI: She is here for follow-up right hip and left knee pain.  Recent hip MRI scan showed acetabular labral tear and calcific tendinopathy of gluteus medius.  Her lateral pain is improved, but she is having a lot of intermittent locking and catching symptoms in the anterior hip.  This has been very bothersome for her.  She is still struggling with chronic left knee pain.  Gel injections were denied by insurance.  She would like to try dextrose prolotherapy.  She also wants to establish primary care and plans to schedule a wellness exam around February.                ROS:   All other systems were reviewed and are negative.  Objective: Vital Signs: There were no vitals taken for this visit.  Physical Exam:  General:  Alert and oriented, in no acute distress. Pulm:  Breathing unlabored. Psy:  Normal mood, congruent affect.  Right hip: Slight tenderness over the greater trochanter. Left knee: Trace effusion with no warmth.   Imaging: No results found.  Assessment & Plan: 1.  Right hip gluteus medius tendinopathy with acetabular labral tear -I think the labral tear is the most symptomatic problem for her.  She would like to consult with a surgeon so I recommended Dr. Aundria Rud.  She will follow-up based on his recommendations.  2.  Left knee osteoarthritis -Elected to try dextrose prolotherapy injection today.  We can repeat in 2 to 3 weeks if needed.     Procedures: Left knee injection: After sterile prep with Betadine, injected 3 cc 0.25% bupivacaine and 2 cc 50% dextrose from lateral midpatellar approach.       PMFS  History: Patient Active Problem List   Diagnosis Date Noted  . Vulvodynia 01/01/2020  . Arthritis 12/03/2019  . Degenerative joint disease of low back 12/03/2019  . Hip arthritis 12/03/2019  . Scoliosis 12/03/2019  . Moderate episode of recurrent major depressive disorder (HCC) 11/14/2019  . History of suicide attempt 11/14/2019  . Other chronic pain 08/14/2019  . Osteoporosis 08/14/2019  . Lichen sclerosus 07/17/2019  . Long term (current) use of non-steroidal anti-inflammatories (nsaid) 08/20/2018  . Gastroesophageal reflux disease 12/26/2015  . Hematuria 12/26/2015  . Vitamin D deficiency 12/26/2015  . Insomnia 12/26/2015   History reviewed. No pertinent past medical history.  History reviewed. No pertinent family history.  History reviewed. No pertinent surgical history. Social History   Occupational History  . Not on file  Tobacco Use  . Smoking status: Not on file  . Smokeless tobacco: Not on file  Substance and Sexual Activity  . Alcohol use: Not on file  . Drug use: Not on file  . Sexual activity: Not on file

## 2020-01-13 ENCOUNTER — Telehealth: Payer: Self-pay | Admitting: Family Medicine

## 2020-01-13 MED ORDER — GABAPENTIN 100 MG PO CAPS: ORAL_CAPSULE | ORAL | 3 refills | Status: DC

## 2020-01-13 NOTE — Telephone Encounter (Signed)
Please advise 

## 2020-01-13 NOTE — Telephone Encounter (Signed)
I called - the patient's husband has already picked up the Rx from [facility].

## 2020-01-13 NOTE — Telephone Encounter (Signed)
Patient called. She would like something called in for her nerve pain. Her call back number [phone]

## 2020-01-13 NOTE — Addendum Note (Signed)
Addended by: Lillia Carmel on: 01/13/2020 11:45 AM   Modules accepted: Orders

## 2020-01-20 DIAGNOSIS — Z20822 Contact with and (suspected) exposure to covid-19: Secondary | ICD-10-CM | POA: Diagnosis not present

## 2020-01-28 ENCOUNTER — Ambulatory Visit: Payer: BC Managed Care – PPO | Admitting: Family Medicine

## 2020-01-28 ENCOUNTER — Other Ambulatory Visit: Payer: Self-pay

## 2020-01-28 ENCOUNTER — Encounter: Payer: Self-pay | Admitting: Family Medicine

## 2020-01-28 DIAGNOSIS — K589 Irritable bowel syndrome without diarrhea: Secondary | ICD-10-CM

## 2020-01-28 DIAGNOSIS — Z1211 Encounter for screening for malignant neoplasm of colon: Secondary | ICD-10-CM | POA: Diagnosis not present

## 2020-01-28 DIAGNOSIS — M546 Pain in thoracic spine: Secondary | ICD-10-CM

## 2020-01-28 NOTE — Addendum Note (Signed)
Addended by: Lillia Carmel on: 01/28/2020 04:00 PM   Modules accepted: Orders

## 2020-01-28 NOTE — Progress Notes (Signed)
   Office Visit Note   Patient: Carla Aguilar           Date of Birth: 1955/07/06           MRN: [id number] Visit Date: 01/28/2020 Requested by: Lavada Mesi, MD [address] [place],  [place] [address] PCP: Lavada Mesi, MD  Subjective: Chief Complaint  Patient presents with  . Other    Nerve pain - interested in further testing for this. Has not started the gabapentin because the paperwork that came with the Rx said it could not be taken with another of her medications. Has an Rx for amitriptyline 10 mg - took 1 and it calmed the symptoms - Is this something she can take prn?    HPI: She is here for follow-up.  She did pretty well with most recent injection.  Over Christmas break she started developing pain between the shoulder blades, and nervelike pain.  Then it started to affect her whole body.  She was afraid to take gabapentin due to potential side effects so she took amitriptyline 10 mg for a couple days and it seemed to help.  She continues to have gastrointestinal issues.  She is interested in pursuing [place] testing.  She is asymptomatic today, but her husband tested positive for Covid.  She has tested herself a couple times with negative results.               ROS:   All other systems were reviewed and are negative.  Objective: Vital Signs: There were no vitals taken for this visit.  Physical Exam:  General:  Alert and oriented, in no acute distress. Pulm:  Breathing unlabored. Psy:  Normal mood, congruent affect.  No exam done today.  Imaging: No results found.  Assessment & Plan: 1.  Thoracic pain, improved. -Amitriptyline as needed, call for refills when needed.  2.  Chronic gastrointestinal issues -Stool testing ordered.  Cologuard also ordered.     Procedures: No procedures performed        PMFS History: Patient Active Problem List   Diagnosis Date Noted  . Vulvodynia 01/01/2020  . Arthritis 12/03/2019  . Degenerative joint disease of  low back 12/03/2019  . Hip arthritis 12/03/2019  . Scoliosis 12/03/2019  . Moderate episode of recurrent major depressive disorder (HCC) 11/14/2019  . History of suicide attempt 11/14/2019  . Other chronic pain 08/14/2019  . Osteoporosis 08/14/2019  . Lichen sclerosus 07/17/2019  . Long term (current) use of non-steroidal anti-inflammatories (nsaid) 08/20/2018  . Gastroesophageal reflux disease 12/26/2015  . Hematuria 12/26/2015  . Vitamin D deficiency 12/26/2015  . Insomnia 12/26/2015   History reviewed. No pertinent past medical history.  History reviewed. No pertinent family history.  History reviewed. No pertinent surgical history. Social History   Occupational History  . Not on file  Tobacco Use  . Smoking status: Not on file  . Smokeless tobacco: Not on file  Substance and Sexual Activity  . Alcohol use: Not on file  . Drug use: Not on file  . Sexual activity: Not on file

## 2020-01-29 ENCOUNTER — Telehealth: Payer: Self-pay | Admitting: Family Medicine

## 2020-01-29 DIAGNOSIS — F439 Reaction to severe stress, unspecified: Secondary | ICD-10-CM

## 2020-01-29 LAB — CBC WITH DIFFERENTIAL/PLATELET
Absolute Monocytes: 460 cells/uL (ref 200–950)
Basophils Absolute: 32 cells/uL (ref 0–200)
Basophils Relative: 0.5 %
Eosinophils Absolute: 69 cells/uL (ref 15–500)
Eosinophils Relative: 1.1 %
HCT: 47.9 % — ABNORMAL HIGH (ref 35.0–45.0)
Hemoglobin: 16.5 g/dL — ABNORMAL HIGH (ref 11.7–15.5)
Lymphs Abs: 1254 cells/uL (ref 850–3900)
MCH: 31.7 pg (ref 27.0–33.0)
MCHC: 34.4 g/dL (ref 32.0–36.0)
MCV: 91.9 fL (ref 80.0–100.0)
MPV: 9.9 fL (ref 7.5–12.5)
Monocytes Relative: 7.3 %
Neutro Abs: 4486 cells/uL (ref 1500–7800)
Neutrophils Relative %: 71.2 %
Platelets: 309 10*3/uL (ref 140–400)
RBC: 5.21 10*6/uL — ABNORMAL HIGH (ref 3.80–5.10)
RDW: 12 % (ref 11.0–15.0)
Total Lymphocyte: 19.9 %
WBC: 6.3 10*3/uL (ref 3.8–10.8)

## 2020-01-29 LAB — COMPREHENSIVE METABOLIC PANEL
AG Ratio: 2 (calc) (ref 1.0–2.5)
ALT: 17 U/L (ref 6–29)
AST: 19 U/L (ref 10–35)
Albumin: 4.8 g/dL (ref 3.6–5.1)
Alkaline phosphatase (APISO): 72 U/L (ref 37–153)
BUN: 15 mg/dL (ref 7–25)
CO2: 28 mmol/L (ref 20–32)
Calcium: 11.5 mg/dL — ABNORMAL HIGH (ref 8.6–10.4)
Chloride: 103 mmol/L (ref 98–110)
Creat: 0.72 mg/dL (ref 0.50–0.99)
Globulin: 2.4 g/dL (calc) (ref 1.9–3.7)
Glucose, Bld: 94 mg/dL (ref 65–99)
Potassium: 5.3 mmol/L (ref 3.5–5.3)
Sodium: 141 mmol/L (ref 135–146)
Total Bilirubin: 0.6 mg/dL (ref 0.2–1.2)
Total Protein: 7.2 g/dL (ref 6.1–8.1)

## 2020-01-29 LAB — VITAMIN B12: Vitamin B-12: 677 pg/mL (ref 200–1100)

## 2020-01-29 LAB — THYROID PANEL WITH TSH
Free Thyroxine Index: 2.2 (ref 1.4–3.8)
T3 Uptake: 35 % (ref 22–35)
T4, Total: 6.4 ug/dL (ref 5.1–11.9)
TSH: 0.86 mIU/L (ref 0.40–4.50)

## 2020-01-29 LAB — VITAMIN D 25 HYDROXY (VIT D DEFICIENCY, FRACTURES): Vit D, 25-Hydroxy: 35 ng/mL (ref 30–100)

## 2020-01-29 NOTE — Addendum Note (Signed)
Addended by: Lillia Carmel on: 01/29/2020 10:29 AM   Modules accepted: Orders

## 2020-01-29 NOTE — Telephone Encounter (Signed)
Labs show:  Hemoglobin and hematocrit are both slightly elevated, almost in the range of polycythemia.  They were elevated like this in 2012, but when checked in 2018 they were normal.  I recommend rechecking in a few months and if still elevated, we will arrange consultation with a hematologist.  Thyroid studies and B12 levels are normal.  Metabolic panel shows slightly elevated calcium level.  Are you taking a calcium supplement?  Vitamin D is low-normal (goal is 50-80).  I recommend doubling your dosage to 4,000 IU daily until they're gone, then buy a 5,000 IU dosage to take long-term.

## 2020-01-31 ENCOUNTER — Encounter: Payer: Self-pay | Admitting: Family Medicine

## 2020-02-01 DIAGNOSIS — N23 Unspecified renal colic: Secondary | ICD-10-CM | POA: Diagnosis not present

## 2020-02-02 ENCOUNTER — Telehealth: Payer: Self-pay | Admitting: Family Medicine

## 2020-02-02 DIAGNOSIS — N200 Calculus of kidney: Secondary | ICD-10-CM

## 2020-02-02 NOTE — Telephone Encounter (Signed)
This goes along with the earlier MyChart message.

## 2020-02-02 NOTE — Telephone Encounter (Signed)
Will do!

## 2020-02-02 NOTE — Telephone Encounter (Signed)
Pt called stating she was diagnosed with kidney stones and was told if she doesn't pass one in the next 3 days she needs to be referred to a urologist   [phone]

## 2020-02-03 ENCOUNTER — Telehealth: Payer: Self-pay | Admitting: Family Medicine

## 2020-02-03 ENCOUNTER — Encounter: Payer: Self-pay | Admitting: Family Medicine

## 2020-02-03 NOTE — Telephone Encounter (Signed)
Can you change the referral to [facility]?

## 2020-02-03 NOTE — Telephone Encounter (Signed)
Pt called asking if she could have a referral for a urologist in [place]? She states [place] was too far. She would like a CB with the location of the new facility we ref her to.   [phone]

## 2020-02-05 NOTE — Telephone Encounter (Signed)
Submitted referral thru [facility], pending appt

## 2020-02-05 NOTE — Telephone Encounter (Signed)
Lmom advising referral was sent to [facility] in [place] and they will review and call to schedule

## 2020-02-25 ENCOUNTER — Encounter: Payer: Self-pay | Admitting: Family Medicine

## 2020-02-25 ENCOUNTER — Other Ambulatory Visit: Payer: Self-pay | Admitting: Family Medicine

## 2020-02-25 DIAGNOSIS — M25551 Pain in right hip: Secondary | ICD-10-CM

## 2020-02-25 DIAGNOSIS — S73191D Other sprain of right hip, subsequent encounter: Secondary | ICD-10-CM

## 2020-02-27 DIAGNOSIS — M6520 Calcific tendinitis, unspecified site: Secondary | ICD-10-CM | POA: Diagnosis not present

## 2020-02-27 DIAGNOSIS — S73191A Other sprain of right hip, initial encounter: Secondary | ICD-10-CM | POA: Diagnosis not present

## 2020-03-01 ENCOUNTER — Ambulatory Visit: Payer: BC Managed Care – PPO | Admitting: Psychology

## 2020-03-03 ENCOUNTER — Telehealth: Payer: Self-pay | Admitting: Family Medicine

## 2020-03-03 NOTE — Telephone Encounter (Signed)
Patient called. She just tested positive for COVID. Would like to know if she can keep her appointment next week. Her call back number is [phone]

## 2020-03-03 NOTE — Telephone Encounter (Signed)
I believe it will depend on how her symptoms progress.

## 2020-03-03 NOTE — Telephone Encounter (Signed)
Her appt is on 2/15. Please advise.

## 2020-03-03 NOTE — Telephone Encounter (Signed)
I called the patient: her symptoms started just last night, with sneezing. Her husband had recently tested positive for covid, so that is why she took the test. Today, her symptoms are mostly nasal stuffiness, some sneezing. I advised her to give [place] a call on Monday if she is still having any major symptoms (her husband's came and went in 72 hours' time), and we can decide then if it would be better to reschedule. As of now, we will leave her appointment in place.

## 2020-03-04 ENCOUNTER — Ambulatory Visit: Payer: BC Managed Care – PPO | Admitting: Urology

## 2020-03-08 ENCOUNTER — Telehealth: Payer: Self-pay | Admitting: Family Medicine

## 2020-03-08 MED ORDER — ZOLPIDEM TARTRATE 5 MG PO TABS: 5.0000 mg | ORAL_TABLET | Freq: Every evening | ORAL | 3 refills | Status: DC | PRN

## 2020-03-08 NOTE — Telephone Encounter (Signed)
Rx sent 

## 2020-03-08 NOTE — Telephone Encounter (Signed)
Moved her physical to 03/24/20. Is a rapid test ok or does it need to be PCR? The test she took last week was rapid.

## 2020-03-08 NOTE — Telephone Encounter (Signed)
Patient called asked if she can still come to her appointment tomorrow? Patient said she had covid-19 symptoms last week but she never had a cough. Patient also asked if she need to have blood work done tomorrow? The number to contact patient is [phone]

## 2020-03-08 NOTE — Telephone Encounter (Signed)
No need to test if we're not doing anything until March 2.

## 2020-03-08 NOTE — Telephone Encounter (Signed)
She should test negative first.

## 2020-03-08 NOTE — Telephone Encounter (Signed)
Please advise on  both questions .

## 2020-03-08 NOTE — Telephone Encounter (Signed)
I called and advised the patient.   She will run out of zolpidem 5 mg on 03/16/20. Can she get a refill of this, so that she will not run out before her physical?

## 2020-03-09 ENCOUNTER — Ambulatory Visit: Payer: BC Managed Care – PPO | Admitting: Family Medicine

## 2020-03-23 ENCOUNTER — Encounter: Payer: Self-pay | Admitting: Family Medicine

## 2020-03-24 ENCOUNTER — Other Ambulatory Visit: Payer: Self-pay

## 2020-03-24 ENCOUNTER — Encounter: Payer: Self-pay | Admitting: Family Medicine

## 2020-03-24 ENCOUNTER — Ambulatory Visit: Payer: BC Managed Care – PPO | Admitting: Family Medicine

## 2020-03-24 VITALS — BP 107/71 | HR 73 | Temp 98.0°F | Ht 63.5 in | Wt 132.6 lb

## 2020-03-24 DIAGNOSIS — M1712 Unilateral primary osteoarthritis, left knee: Secondary | ICD-10-CM | POA: Diagnosis not present

## 2020-03-24 DIAGNOSIS — F331 Major depressive disorder, recurrent, moderate: Secondary | ICD-10-CM

## 2020-03-24 DIAGNOSIS — F439 Reaction to severe stress, unspecified: Secondary | ICD-10-CM | POA: Diagnosis not present

## 2020-03-24 DIAGNOSIS — Z87442 Personal history of urinary calculi: Secondary | ICD-10-CM | POA: Diagnosis not present

## 2020-03-24 DIAGNOSIS — L900 Lichen sclerosus et atrophicus: Secondary | ICD-10-CM

## 2020-03-24 DIAGNOSIS — Z0000 Encounter for general adult medical examination without abnormal findings: Secondary | ICD-10-CM

## 2020-03-24 DIAGNOSIS — E559 Vitamin D deficiency, unspecified: Secondary | ICD-10-CM

## 2020-03-24 DIAGNOSIS — K589 Irritable bowel syndrome without diarrhea: Secondary | ICD-10-CM | POA: Diagnosis not present

## 2020-03-24 DIAGNOSIS — G4700 Insomnia, unspecified: Secondary | ICD-10-CM

## 2020-03-24 DIAGNOSIS — K219 Gastro-esophageal reflux disease without esophagitis: Secondary | ICD-10-CM

## 2020-03-24 NOTE — Progress Notes (Signed)
Office Visit Note   Patient: Carla Aguilar           Date of Birth: 1955/08/17           MRN: [id number] Visit Date: 03/24/2020 Requested by: Lavada Mesi, MD [address] [place],  [place] [address] PCP: Lavada Mesi, MD  Subjective: No chief complaint on file.   HPI: She is here for a wellness examination.  She has recovered from COVID, no residual effects.  Continues to complain of pain in multiple joints, particularly her left knee and her right hip.  She has a history of hematuria.  She has never seen a urologist for this.  She has never officially been diagnosed with kidney stones.  She did have an episode of flank pain not long ago and had microscopic hematuria at the urgent care, but they did not see a kidney stone on plain films.  Labs have been notable for elevated calcium level recently.  She also have slightly elevated hemoglobin and hematocrit.  She continues to have chronic irritable bowel syndrome symptoms.  She also has a personal history of osteoporosis and a family history of heart disease on her mother side.  Patient states that her lipids have been elevated in the past.  She has never had troubles with her blood pressure.  She is up-to-date on gynecology evaluations including mammograms.  She has had a colonoscopy in the past and is wanting to have Cologuard this time.  Colonoscopy was normal other than hemorrhoids.                ROS: Left knee is hurting a lot today.  All other systems were reviewed and are negative.  Objective: Vital Signs: BP 107/71 (BP Location: Left Arm, Patient Position: Sitting, Cuff Size: Normal)   Pulse 73   Temp 98 F (36.7 C) (Oral)   Ht 5' 3.5" (1.613 m)   Wt 132 lb 9.6 oz (60.1 kg)   SpO2 98%   BMI 23.12 kg/m   Physical Exam:  General:  Alert and oriented, in no acute distress. Pulm:  Breathing unlabored. Psy:  Normal mood, congruent affect. Skin:  No suspicious lesions.  HEENT:  [place]/AT, PERRLA, EOM Full, no  nystagmus.  Funduscopic examination within normal limits.  No conjunctival erythema.  Tympanic membranes are pearly gray with normal landmarks.  External ear canals are normal.  Nasal passages are clear.  Oropharynx is clear.  No significant lymphadenopathy.  No thyromegaly or nodules.  2+ carotid pulses without bruits. CV: Regular rate and rhythm without murmurs, rubs, or gallops.  No peripheral edema.  2+ radial and posterior tibial pulses. Lungs: Clear to auscultation throughout with no wheezing or areas of consolidation. Abd: Bowel sounds are active, no hepatosplenomegaly or masses.  Soft and nontender.  No audible bruits.  No evidence of ascites. Left knee: No effusion today.  Tender on the medial joint line.   Imaging: No results found.  Assessment & Plan: 1.  Wellness examination -Labs drawn today.  Follow-up yearly.  Proceed with Cologuard test.  2.  Left knee DJD - Dextrose prolo given today (6 cc 0.25% bupivacaine and 4 cc 50% dextrose, medial midpatellar approach).  3.  Hypercalcemia - Recheck level with PTH and D today.  4.  Hematuria - If persists, consider urology referral.  5.  Osteoporosis - Will work on gut healing to improve absorption. - Proceed with [place] tests.  6.  Elevated hemoglobin - If persists, consider hematology consult.  Procedures: No procedures performed        PMFS History: Patient Active Problem List   Diagnosis Date Noted  . History of nephrolithiasis 03/24/2020  . Irritable bowel syndrome 03/24/2020  . Vulvodynia 01/01/2020  . Arthritis 12/03/2019  . Degenerative joint disease of low back 12/03/2019  . Hip arthritis 12/03/2019  . Scoliosis 12/03/2019  . Moderate episode of recurrent major depressive disorder (HCC) 11/14/2019  . History of suicide attempt 11/14/2019  . Other chronic pain 08/14/2019  . Osteoporosis 08/14/2019  . Lichen sclerosus 07/17/2019  . Long term (current) use of non-steroidal anti-inflammatories  (nsaid) 08/20/2018  . Gastroesophageal reflux disease 12/26/2015  . Hematuria 12/26/2015  . Vitamin D deficiency 12/26/2015  . Insomnia 12/26/2015   History reviewed. No pertinent past medical history.  Family History  Problem Relation Age of Onset  . Heart disease Mother   . Hypertension Mother   . Arrhythmia Mother   . Cirrhosis Father   . Kidney failure Father   . Diabetes Father   . Arthritis Sister   . Arrhythmia Sister   . Arrhythmia Maternal Uncle   . Heart disease Maternal Grandfather   . Cancer Neg Hx     History reviewed. No pertinent surgical history. Social History   Occupational History  . Not on file  Tobacco Use  . Smoking status: Never Smoker  . Smokeless tobacco: Never Used  Substance and Sexual Activity  . Alcohol use: Never  . Drug use: Not on file  . Sexual activity: Not on file

## 2020-03-26 ENCOUNTER — Telehealth: Payer: Self-pay | Admitting: Family Medicine

## 2020-03-26 LAB — LIPID PANEL
Cholesterol: 238 mg/dL — ABNORMAL HIGH (ref <200)
HDL: 83 mg/dL (ref >=50)
LDL Cholesterol (Calc): 139 mg/dL (calc) — ABNORMAL HIGH
Non-HDL Cholesterol (Calc): 155 mg/dL (calc) — ABNORMAL HIGH (ref <130)
Total CHOL/HDL Ratio: 2.9 (calc) (ref <5.0)
Triglycerides: 70 mg/dL (ref <150)

## 2020-03-26 LAB — URINALYSIS W MICROSCOPIC + REFLEX CULTURE
Bilirubin Urine: NEGATIVE
Glucose, UA: NEGATIVE
Hgb urine dipstick: NEGATIVE
Hyaline Cast: NONE SEEN /LPF
Ketones, ur: NEGATIVE
Nitrites, Initial: POSITIVE — AB
Protein, ur: NEGATIVE
RBC / HPF: NONE SEEN /HPF (ref 0–2)
Specific Gravity, Urine: 1.008 (ref 1.001–1.03)
Squamous Epithelial / HPF: NONE SEEN /HPF (ref <=5)
WBC, UA: NONE SEEN /HPF (ref 0–5)
pH: 7 (ref 5.0–8.0)

## 2020-03-26 LAB — URINE CULTURE
MICRO NUMBER:: 11607210
SPECIMEN QUALITY:: ADEQUATE

## 2020-03-26 LAB — CBC WITH DIFFERENTIAL/PLATELET
Absolute Monocytes: 507 cells/uL (ref 200–950)
Basophils Absolute: 26 cells/uL (ref 0–200)
Basophils Relative: 0.3 %
Eosinophils Absolute: 86 cells/uL (ref 15–500)
Eosinophils Relative: 1 %
HCT: 47.7 % — ABNORMAL HIGH (ref 35.0–45.0)
Hemoglobin: 16.4 g/dL — ABNORMAL HIGH (ref 11.7–15.5)
Lymphs Abs: 929 cells/uL (ref 850–3900)
MCH: 31.6 pg (ref 27.0–33.0)
MCHC: 34.4 g/dL (ref 32.0–36.0)
MCV: 91.9 fL (ref 80.0–100.0)
MPV: 9.9 fL (ref 7.5–12.5)
Monocytes Relative: 5.9 %
Neutro Abs: 7052 cells/uL (ref 1500–7800)
Neutrophils Relative %: 82 %
Platelets: 347 10*3/uL (ref 140–400)
RBC: 5.19 10*6/uL — ABNORMAL HIGH (ref 3.80–5.10)
RDW: 11.8 % (ref 11.0–15.0)
Total Lymphocyte: 10.8 %
WBC: 8.6 10*3/uL (ref 3.8–10.8)

## 2020-03-26 LAB — PTH, INTACT AND CALCIUM
Calcium: 10.7 mg/dL — ABNORMAL HIGH (ref 8.6–10.4)
PTH: 48 pg/mL (ref 14–64)

## 2020-03-26 LAB — CULTURE INDICATED

## 2020-03-26 LAB — VITAMIN D 25 HYDROXY (VIT D DEFICIENCY, FRACTURES): Vit D, 25-Hydroxy: 46 ng/mL (ref 30–100)

## 2020-03-26 NOTE — Telephone Encounter (Signed)
Labs are notable for the following:  RBC, hemoglobin and hematocrit remain slightly elevated.  Reason is unclear.  I will request hematology consultation.  Urine culture did not grow bacteria.  There was no blood in the urine.  HDL and triglycerides look excellent.  Total and LDL cholesterol are elevated.  Generally, these numbers will improve with regular exercise and dietary limitation of processed carbohydrates and sweets.  Recheck in 6 to 12 months.  Vitamin D looks good, and parathyroid hormone is normal and calcium is almost normal.  Would recheck calcium in 3 to 6 months to be sure it has totally normalized.

## 2020-03-29 ENCOUNTER — Ambulatory Visit (INDEPENDENT_AMBULATORY_CARE_PROVIDER_SITE_OTHER): Payer: BC Managed Care – PPO | Admitting: Psychologist

## 2020-03-29 ENCOUNTER — Ambulatory Visit: Payer: BC Managed Care – PPO | Admitting: Psychology

## 2020-03-29 DIAGNOSIS — Z01419 Encounter for gynecological examination (general) (routine) without abnormal findings: Secondary | ICD-10-CM | POA: Diagnosis not present

## 2020-03-29 DIAGNOSIS — F4521 Hypochondriasis: Secondary | ICD-10-CM

## 2020-03-29 DIAGNOSIS — Z6823 Body mass index (BMI) 23.0-23.9, adult: Secondary | ICD-10-CM | POA: Diagnosis not present

## 2020-03-30 ENCOUNTER — Encounter: Payer: Self-pay | Admitting: Family Medicine

## 2020-03-30 MED ORDER — ZOLPIDEM TARTRATE 5 MG PO TABS: 5.0000 mg | ORAL_TABLET | Freq: Every evening | ORAL | 3 refills | Status: DC | PRN

## 2020-03-30 MED ORDER — MELOXICAM 15 MG PO TABS: 7.5000 mg | ORAL_TABLET | Freq: Every day | ORAL | 6 refills | Status: DC | PRN

## 2020-03-31 DIAGNOSIS — M7061 Trochanteric bursitis, right hip: Secondary | ICD-10-CM | POA: Diagnosis not present

## 2020-03-31 DIAGNOSIS — S73191D Other sprain of right hip, subsequent encounter: Secondary | ICD-10-CM | POA: Diagnosis not present

## 2020-04-13 ENCOUNTER — Ambulatory Visit: Payer: BC Managed Care – PPO | Admitting: Psychologist

## 2020-04-13 ENCOUNTER — Encounter: Payer: Self-pay | Admitting: Family Medicine

## 2020-04-19 DIAGNOSIS — M5416 Radiculopathy, lumbar region: Secondary | ICD-10-CM | POA: Diagnosis not present

## 2020-04-27 ENCOUNTER — Ambulatory Visit (INDEPENDENT_AMBULATORY_CARE_PROVIDER_SITE_OTHER): Payer: BC Managed Care – PPO | Admitting: Psychologist

## 2020-04-27 DIAGNOSIS — F4521 Hypochondriasis: Secondary | ICD-10-CM | POA: Diagnosis not present

## 2020-04-30 ENCOUNTER — Ambulatory Visit: Payer: Self-pay

## 2020-04-30 ENCOUNTER — Encounter: Payer: Self-pay | Admitting: Family Medicine

## 2020-04-30 ENCOUNTER — Ambulatory Visit: Payer: BC Managed Care – PPO | Admitting: Family Medicine

## 2020-04-30 ENCOUNTER — Other Ambulatory Visit: Payer: Self-pay

## 2020-04-30 DIAGNOSIS — M1712 Unilateral primary osteoarthritis, left knee: Secondary | ICD-10-CM

## 2020-04-30 DIAGNOSIS — M25551 Pain in right hip: Secondary | ICD-10-CM

## 2020-04-30 NOTE — Progress Notes (Signed)
Noted. Will speak with patient before submitting

## 2020-04-30 NOTE — Progress Notes (Signed)
Office Visit Note   Patient: Carla Aguilar           Date of Birth: 05-31-1955           MRN: [id number] Visit Date: 04/30/2020 Requested by: Lavada Mesi, MD [address] [place],  [place] [address] PCP: Lavada Mesi, MD  Subjective: Chief Complaint  Patient presents with  . Right Hip - Pain    Saw a surgeon regarding her right hip - see ov note (Dr. Shellia Cleverly). He recommended surgery. She would like to know if an injection is an option that may give her some relief/have less "bad days." Today, the pain is not as bad. Buttock pain is better with PT. The pain is more on the lateral aspect - "crunchy" with "sharp pains" that wake her from sleep.    HPI: She is here with ongoing right hip pain.  Labral tear with tendinopathy.  Surgeon felt that he could eliminate some of her pain with arthroscopic intervention but not all of her pain.  She is hesitant to do that.  She has been working out IT band and tendinopathy, but tendinopathy seems to be better with physical therapy.  She would like to try laser therapy.  She is also interested in acupuncture.  Her left knee osteoarthritis has been bothering her, she would like to try gel injections but her insurance does not cover them.               ROS:   All other systems were reviewed and are negative.  Objective: Vital Signs: There were no vitals taken for this visit.  Physical Exam:  General:  Alert and oriented, in no acute distress. Pulm:  Breathing unlabored. Psy:  Normal mood, congruent affect  Right hip: She has good range of motion today, some pain with internal rotation but not a lot.  Imaging: No results found.  Assessment & Plan: 1.  Right hip labral tear with tendinopathy -She will try acupuncture and laser therapy.  Consider dextrose intra-articular injection in the future if/when we get dextrose again.  Another consideration is PRP.  2.  Left knee osteoarthritis -We will try to get Trivisc injections for  her.     Procedures: No procedures performed        PMFS History: Patient Active Problem List   Diagnosis Date Noted  . History of nephrolithiasis 03/24/2020  . Irritable bowel syndrome 03/24/2020  . Vulvodynia 01/01/2020  . Arthritis 12/03/2019  . Degenerative joint disease of low back 12/03/2019  . Hip arthritis 12/03/2019  . Scoliosis 12/03/2019  . Moderate episode of recurrent major depressive disorder (HCC) 11/14/2019  . History of suicide attempt 11/14/2019  . Other chronic pain 08/14/2019  . Osteoporosis 08/14/2019  . Lichen sclerosus 07/17/2019  . Long term (current) use of non-steroidal anti-inflammatories (nsaid) 08/20/2018  . Gastroesophageal reflux disease 12/26/2015  . Hematuria 12/26/2015  . Vitamin D deficiency 12/26/2015  . Insomnia 12/26/2015   No past medical history on file.  Family History  Problem Relation Age of Onset  . Heart disease Mother   . Hypertension Mother   . Arrhythmia Mother   . Cirrhosis Father   . Kidney failure Father   . Diabetes Father   . Arthritis Sister   . Arrhythmia Sister   . Arrhythmia Maternal Uncle   . Heart disease Maternal Grandfather   . Cancer Neg Hx     No past surgical history on file. Social History   Occupational History  .  Not on file  Tobacco Use  . Smoking status: Never Smoker  . Smokeless tobacco: Never Used  Substance and Sexual Activity  . Alcohol use: Never  . Drug use: Not on file  . Sexual activity: Not on file

## 2020-05-12 ENCOUNTER — Ambulatory Visit (INDEPENDENT_AMBULATORY_CARE_PROVIDER_SITE_OTHER): Payer: BC Managed Care – PPO | Admitting: Psychologist

## 2020-05-12 DIAGNOSIS — F4521 Hypochondriasis: Secondary | ICD-10-CM

## 2020-05-18 DIAGNOSIS — H6500 Acute serous otitis media, unspecified ear: Secondary | ICD-10-CM | POA: Diagnosis not present

## 2020-05-18 DIAGNOSIS — J019 Acute sinusitis, unspecified: Secondary | ICD-10-CM | POA: Diagnosis not present

## 2020-06-03 ENCOUNTER — Ambulatory Visit: Payer: BC Managed Care – PPO | Admitting: Psychologist

## 2020-06-08 ENCOUNTER — Telehealth: Payer: Self-pay

## 2020-06-08 NOTE — Telephone Encounter (Signed)
Called and left a VM advising patient to CB concerning gel injection for left knee due to her insurance not covering gel injection and we could try TriVisc.

## 2020-06-09 ENCOUNTER — Telehealth: Payer: Self-pay | Admitting: Family Medicine

## 2020-06-09 NOTE — Telephone Encounter (Signed)
Pt called and she said she wants to try the TriVisc and go ahead get scheduled as soon as possible. CB [phone]

## 2020-06-09 NOTE — Telephone Encounter (Signed)
Tried calling patient back to advise her that I submitted for TriVisc, left knee and that she would receive a phone call from [facility], but no answer and Vm was full, no message left.

## 2020-06-10 ENCOUNTER — Telehealth: Payer: Self-pay | Admitting: Family Medicine

## 2020-06-10 NOTE — Telephone Encounter (Signed)
Spoke with patient advised she will need to pay $250.00 to [facility] before they will send [facility]  the Trivisc injection medication.  Patient will contact [facility].

## 2020-06-11 ENCOUNTER — Encounter: Payer: Self-pay | Admitting: Family Medicine

## 2020-06-23 ENCOUNTER — Ambulatory Visit: Payer: BC Managed Care – PPO | Admitting: Family Medicine

## 2020-06-23 ENCOUNTER — Other Ambulatory Visit: Payer: Self-pay

## 2020-06-23 ENCOUNTER — Encounter: Payer: Self-pay | Admitting: Family Medicine

## 2020-06-23 VITALS — BP 117/84 | HR 97 | Ht 63.5 in | Wt 131.0 lb

## 2020-06-23 DIAGNOSIS — H9319 Tinnitus, unspecified ear: Secondary | ICD-10-CM | POA: Diagnosis not present

## 2020-06-23 NOTE — Patient Instructions (Signed)
    Glucosamine Sulfate:  1,000 mg twice daily  Turmeric:  500 mg twice daily   Wean off meloxicam as able.

## 2020-06-23 NOTE — Progress Notes (Signed)
   Office Visit Note   Patient: Carla Aguilar           Date of Birth: May 29, 1955           MRN: [id number] Visit Date: 06/23/2020 Requested by: Lavada Mesi, MD [address] [place],  [place] [address] PCP: Lavada Mesi, MD  Subjective: Chief Complaint  Patient presents with  . Other    Tried going off the meloxicam because of the tinnitus. She made it 3 days and then her joints were hurting too badly and she restarted at 7.5 mg daily. Is there anything else she can take? Is seeing [facility] for acupuncture treatments for her aches. He is also working on the tinnitus but he has told her that it is one of the harder issues to treat.    HPI: She is here with tinnitus.  She tried stopping meloxicam but her arthritis pains became intense, especially in her hands.  She is now back on meloxicam but at 7.5 mg daily.  Acupuncture treatments are helping quite a bit.  She wonders what else she can try in order to get off anti-inflammatories.              ROS:   All other systems were reviewed and are negative.  Objective: Vital Signs: BP 117/84   Pulse 97   Ht 5' 3.5" (1.613 m)   Wt 131 lb (59.4 kg)   BMI 22.84 kg/m   Physical Exam:  General:  Alert and oriented, in no acute distress. Pulm:  Breathing unlabored. Psy:  Normal mood, congruent affect.  Ears: No cerumen impaction.  Imaging: No results found.  Assessment & Plan: 1.  Tinnitus -We will try glucosamine and turmeric.  Wean off meloxicam as able.  If symptoms persist, then audiology evaluation.     Procedures: No procedures performed        PMFS History: Patient Active Problem List   Diagnosis Date Noted  . History of nephrolithiasis 03/24/2020  . Irritable bowel syndrome 03/24/2020  . Vulvodynia 01/01/2020  . Arthritis 12/03/2019  . Degenerative joint disease of low back 12/03/2019  . Hip arthritis 12/03/2019  . Scoliosis 12/03/2019  . Moderate episode of recurrent major depressive disorder (HCC)  11/14/2019  . History of suicide attempt 11/14/2019  . Other chronic pain 08/14/2019  . Osteoporosis 08/14/2019  . Lichen sclerosus 07/17/2019  . Long term (current) use of non-steroidal anti-inflammatories (nsaid) 08/20/2018  . Gastroesophageal reflux disease 12/26/2015  . Hematuria 12/26/2015  . Vitamin D deficiency 12/26/2015  . Insomnia 12/26/2015   History reviewed. No pertinent past medical history.  Family History  Problem Relation Age of Onset  . Heart disease Mother   . Hypertension Mother   . Arrhythmia Mother   . Cirrhosis Father   . Kidney failure Father   . Diabetes Father   . Arthritis Sister   . Arrhythmia Sister   . Arrhythmia Maternal Uncle   . Heart disease Maternal Grandfather   . Cancer Neg Hx     History reviewed. No pertinent surgical history. Social History   Occupational History  . Not on file  Tobacco Use  . Smoking status: Never Smoker  . Smokeless tobacco: Never Used  Substance and Sexual Activity  . Alcohol use: Never  . Drug use: Not on file  . Sexual activity: Not on file

## 2020-06-25 ENCOUNTER — Telehealth: Payer: Self-pay

## 2020-06-25 NOTE — Telephone Encounter (Signed)
Received TriVisc for left knee for patient. Patient Purchased Patient is aware and appointment has been scheduled.

## 2020-06-30 ENCOUNTER — Ambulatory Visit (INDEPENDENT_AMBULATORY_CARE_PROVIDER_SITE_OTHER): Payer: BC Managed Care – PPO | Admitting: Family Medicine

## 2020-06-30 ENCOUNTER — Other Ambulatory Visit: Payer: Self-pay

## 2020-06-30 ENCOUNTER — Encounter: Payer: Self-pay | Admitting: Family Medicine

## 2020-06-30 DIAGNOSIS — M1712 Unilateral primary osteoarthritis, left knee: Secondary | ICD-10-CM

## 2020-06-30 NOTE — Progress Notes (Signed)
   Office Visit Note   Patient: Carla Aguilar           Date of Birth: 1955-02-19           MRN: [id number] Visit Date: 06/30/2020 Requested by: Lavada Mesi, MD [address] [place],  [place] [address] PCP: Lavada Mesi, MD  Subjective: Chief Complaint  Patient presents with  . Left Knee - Pain    Planned Trivisc #1 injection    HPI: She is here for Trivisc #1 for left knee osteoarthritis..  In the past she has had a couple reactions to gel injections, with knee swelling and one-time pseudogout.               ROS:   All other systems were reviewed and are negative.  Objective: Vital Signs: There were no vitals taken for this visit.  Physical Exam:  General:  Alert and oriented, in no acute distress. Pulm:  Breathing unlabored. Psy:  Normal mood, congruent affect.  Likely: No significant effusion, no warmth or erythema.  Imaging: No results found.  Assessment & Plan: 1.  Left knee DJD -Injection #1 today, follow-up in 1 to 2 weeks for the second of 3 injections.     Procedures: After sterile prep with Betadine, injected 3 cc 1% lidocaine without epinephrine and Trivisc lateral midpatellar approach.       PMFS History: Patient Active Problem List   Diagnosis Date Noted  . History of nephrolithiasis 03/24/2020  . Irritable bowel syndrome 03/24/2020  . Vulvodynia 01/01/2020  . Arthritis 12/03/2019  . Degenerative joint disease of low back 12/03/2019  . Hip arthritis 12/03/2019  . Scoliosis 12/03/2019  . Moderate episode of recurrent major depressive disorder (HCC) 11/14/2019  . History of suicide attempt 11/14/2019  . Other chronic pain 08/14/2019  . Osteoporosis 08/14/2019  . Lichen sclerosus 07/17/2019  . Long term (current) use of non-steroidal anti-inflammatories (nsaid) 08/20/2018  . Gastroesophageal reflux disease 12/26/2015  . Hematuria 12/26/2015  . Vitamin D deficiency 12/26/2015  . Insomnia 12/26/2015   No past medical history on file.   Family History  Problem Relation Age of Onset  . Heart disease Mother   . Hypertension Mother   . Arrhythmia Mother   . Cirrhosis Father   . Kidney failure Father   . Diabetes Father   . Arthritis Sister   . Arrhythmia Sister   . Arrhythmia Maternal Uncle   . Heart disease Maternal Grandfather   . Cancer Neg Hx     No past surgical history on file. Social History   Occupational History  . Not on file  Tobacco Use  . Smoking status: Never Smoker  . Smokeless tobacco: Never Used  Substance and Sexual Activity  . Alcohol use: Never  . Drug use: Not on file  . Sexual activity: Not on file

## 2020-08-30 ENCOUNTER — Telehealth: Payer: Self-pay | Admitting: Family Medicine

## 2020-08-30 MED ORDER — MELOXICAM 7.5 MG PO TABS: 7.5000 mg | ORAL_TABLET | Freq: Two times a day (BID) | ORAL | 6 refills | Status: DC | PRN

## 2020-08-30 MED ORDER — ZOLPIDEM TARTRATE 5 MG PO TABS: 5.0000 mg | ORAL_TABLET | Freq: Every evening | ORAL | 3 refills | Status: DC | PRN

## 2020-08-30 NOTE — Telephone Encounter (Signed)
Per the 03/26/20 MyChart message regarding lab results, the patient requested to hold off on the hematology consult since her RBC numbers had improved a little. Please advise.

## 2020-08-30 NOTE — Telephone Encounter (Signed)
Patient called. She would like Terri to call her at work. Has some questions. [phone]

## 2020-08-30 NOTE — Telephone Encounter (Signed)
I called and advised the patient both meds have been filled, with the change on the meloxicam mg.

## 2020-08-30 NOTE — Telephone Encounter (Signed)
Requesting refills on zolpidem and meloxicam. She would like to change the meloxicam from 15 mg to 7.5 mg, though. [facility]. Also, she asks if she needs any more blood work, or is she ok for now.

## 2020-09-15 ENCOUNTER — Ambulatory Visit (INDEPENDENT_AMBULATORY_CARE_PROVIDER_SITE_OTHER): Payer: BC Managed Care – PPO

## 2020-09-15 ENCOUNTER — Ambulatory Visit: Payer: BC Managed Care – PPO | Admitting: Podiatry

## 2020-09-15 ENCOUNTER — Other Ambulatory Visit: Payer: Self-pay

## 2020-09-15 ENCOUNTER — Encounter: Payer: Self-pay | Admitting: Podiatry

## 2020-09-15 DIAGNOSIS — B351 Tinea unguium: Secondary | ICD-10-CM | POA: Diagnosis not present

## 2020-09-15 DIAGNOSIS — M069 Rheumatoid arthritis, unspecified: Secondary | ICD-10-CM

## 2020-09-15 DIAGNOSIS — G5792 Unspecified mononeuropathy of left lower limb: Secondary | ICD-10-CM

## 2020-09-15 DIAGNOSIS — M792 Neuralgia and neuritis, unspecified: Secondary | ICD-10-CM

## 2020-09-15 DIAGNOSIS — M79675 Pain in left toe(s): Secondary | ICD-10-CM

## 2020-09-15 NOTE — Progress Notes (Signed)
   HPI: 65 y.o. female presenting today as a new patient for evaluation of bilateral toe numbness that happens when the patient sits down.  She states that she does have a history of back and spine pathology including bulging disc, scoliosis, and degenerative changes with spurring.  Patient also has arthritis throughout her body so she states. Also the patient has noticed some discoloration to the distal tip of the left great toenail plate.  She would like to have it evaluated.  No past medical history on file.   Physical Exam: General: The patient is alert and oriented x3 in no acute distress.  Dermatology: Skin is warm, dry and supple bilateral lower extremities. Negative for open lesions or macerations.  There is some mild discoloration to the distal tip central portion of the left hallux nail plate.  Consistent with possible beginning of onychomycosis  Vascular: Palpable pedal pulses bilaterally. No edema or erythema noted. Capillary refill within normal limits.  Neurological: Epicritic and protective threshold grossly intact bilaterally.   Musculoskeletal Exam: No pedal deformity noted.  There is an adductovarus hammertoe of the fifth digit of the left foot which is asymptomatic.  Radiographic Exam:  Normal osseous mineralization. Joint spaces preserved. No fracture/dislocation/boney destruction.    Assessment: 1.  Onychomycosis of toenail distal tip left hallux nail plate 2.  Neuritis of bilateral great toes secondary to likely lumbar radiculopathy   Plan of Care:  1. Patient evaluated. X-Rays reviewed.  2.  Recommend OTC antifungal formula 3 antifungal nail lacquer to apply to the distal tip of the left hallux nail plate 3.  Explained to the patient that her symptoms are likely coming from her back.  Recommend follow-up with a neuro spine specialist 4.  Return to clinic as needed      Felecia Shelling, DPM [facility]  Dr. Felecia Shelling, DPM    [address]  [place], [place] [address]                Office [phone]  Fax [phone]

## 2020-09-17 ENCOUNTER — Other Ambulatory Visit: Payer: Self-pay | Admitting: Podiatry

## 2020-09-22 ENCOUNTER — Other Ambulatory Visit: Payer: Self-pay | Admitting: Podiatry

## 2020-09-22 DIAGNOSIS — G5792 Unspecified mononeuropathy of left lower limb: Secondary | ICD-10-CM

## 2020-09-22 DIAGNOSIS — M792 Neuralgia and neuritis, unspecified: Secondary | ICD-10-CM

## 2020-09-29 DIAGNOSIS — L900 Lichen sclerosus et atrophicus: Secondary | ICD-10-CM | POA: Diagnosis not present

## 2020-10-08 ENCOUNTER — Encounter (HOSPITAL_BASED_OUTPATIENT_CLINIC_OR_DEPARTMENT_OTHER): Payer: Self-pay | Admitting: Family Medicine

## 2020-10-08 ENCOUNTER — Ambulatory Visit (INDEPENDENT_AMBULATORY_CARE_PROVIDER_SITE_OTHER): Payer: BC Managed Care – PPO | Admitting: Family Medicine

## 2020-10-08 ENCOUNTER — Other Ambulatory Visit: Payer: Self-pay

## 2020-10-08 VITALS — BP 120/84 | HR 74 | Ht 64.0 in | Wt 132.0 lb

## 2020-10-08 DIAGNOSIS — G4700 Insomnia, unspecified: Secondary | ICD-10-CM | POA: Diagnosis not present

## 2020-10-08 DIAGNOSIS — D751 Secondary polycythemia: Secondary | ICD-10-CM | POA: Diagnosis not present

## 2020-10-08 MED ORDER — MELOXICAM 7.5 MG PO TABS: 7.5000 mg | ORAL_TABLET | Freq: Two times a day (BID) | ORAL | 1 refills | Status: DC | PRN

## 2020-10-08 MED ORDER — ZOLPIDEM TARTRATE 5 MG PO TABS: 5.0000 mg | ORAL_TABLET | Freq: Every evening | ORAL | 1 refills | Status: DC | PRN

## 2020-10-08 NOTE — Patient Instructions (Addendum)
  Medication Instructions:  Your physician recommends that you continue on your current medications as directed. Please refer to the Current Medication list given to you today. --If you need a refill on any your medications before your next appointment, please call your pharmacy first. If no refills are authorized on file call the office.--  Lab Work: Your physician has recommended that you have lab work today: CBC, Calcium, Vitamin D,  If you have labs (blood work) drawn today and your tests are completely normal, you will receive your results via MyChart message OR a phone call from our staff.  Please ensure you check your voicemail in the event that you authorized detailed messages to be left on a delegated number. If you have any lab test that is abnormal or we need to change your treatment, we will call you to review the results.  Follow-Up: Your next appointment:   Your physician recommends that you schedule a follow-up appointment in: 4 MONTHS with Dr. de [place]  Thanks for letting us be apart of your health journey!!  Primary Care and Sports Medicine   Dr. Ceasar Mons [place]   We encourage you to activate your patient portal called "MyChart".  Sign up information is provided on this After Visit Summary.  MyChart is used to connect with patients for Virtual Visits (Telemedicine).  Patients are able to view lab/test results, encounter notes, upcoming appointments, etc.  Non-urgent messages can be sent to your provider as well. To learn more about what you can do with MyChart, please visit --  [email or web address].

## 2020-10-08 NOTE — Progress Notes (Signed)
New Patient Office Visit  Subjective:  Patient ID: Carla Aguilar, female    DOB: [birth year]  Age: 65 y.o. MRN: [id number]  CC:  Chief Complaint  Patient presents with   Establish Care    PrIor PCP - Dr. Prince Rome - Notes in Epic   Medication Refill    Patient needs refill on meloxicam and ambien   Lab Work    Patient is requesting to update lab levels. Previously she was told she may potentially need a hematology referral but states things were resolving to referral was deferred. She also states she wants her vit d levels checked.    HPI Carla Aguilar is is a 65 year old female presenting to establish in clinic.  She has current concerns as outlined above.  Reports past medical history of osteoarthritis, insomnia.  Has had recent issue with laboratory abnormalities including elevated calcium, elevated RBC and hemoglobin, tinnitus.  Insomnia: Manages with nightly Ambien.  Indicates that oftentimes she will break the Ambien in half and take half prior to bed.  If she does awake during the night she will use the other half to help with falling back to sleep.  Reports that she has worked with Dr. Bosie Clos in the past regarding management of anxiety, however no significant improvement with this.  She has also tried acupuncture which she has found to be more helpful.  Calcium: Found to be elevated on prior labs.  Plan with prior PCP was to repeat labs in about 3 to 6 months.  Last check was in March.  Elevated RBC and hemoglobin: This has also been found to be elevated on prior lab results.  Recommendation from prior PCP was to proceed with evaluation with hematology, however patient wished to defer this.  Plan was to continue monitoring labs at future visits with prior PCP.  Tenderness: Has been going on for a few months.  Reports that it started about 3 months after coronavirus infection earlier this year.  Symptoms are bilateral, left worse than right.  Reports working with acupuncturist  regarding this.  Evaluation with audiology has been recommended in the past, however she has not proceeded with this.  Still symptoms at present, however she feels that it is not significant and has not noticed significant worsening.  She reports having a stressful job and that associated increased stress will make her musculoskeletal issues worse as well as her tinnitus worse.  Past Medical History:  Diagnosis Date   Anxiety    Arthritis     No past surgical history on file.  Family History  Problem Relation Age of Onset   Heart disease Mother    Hypertension Mother    Arrhythmia Mother    Arthritis Mother    Kidney disease Mother    Cirrhosis Father    Kidney failure Father    Diabetes Father    Kidney disease Father    Arthritis Sister    Arrhythmia Sister    Arrhythmia Maternal Uncle    Arthritis Maternal Uncle    Heart disease Maternal Grandfather    Arthritis Paternal Uncle    Heart disease Maternal Uncle    Cancer Neg Hx     Social History   Socioeconomic History   Marital status: Married    Spouse name: Not on file   Number of children: Not on file   Years of education: Not on file   Highest education level: Not on file  Occupational History   Not on file  Tobacco Use   Smoking status: Never   Smokeless tobacco: Never  Substance and Sexual Activity   Alcohol use: Yes    Alcohol/week: 14.0 standard drinks    Types: 14 Glasses of wine per week   Drug use: Never   Sexual activity: Yes    Birth control/protection: None  Other Topics Concern   Not on file  Social History Narrative   Not on file   Social Determinants of Health   Financial Resource Strain: Not on file  Food Insecurity: Not on file  Transportation Needs: Not on file  Physical Activity: Not on file  Stress: Not on file  Social Connections: Not on file  Intimate Partner Violence: Not on file    Objective:   Today's Vitals: BP 120/84   Pulse 74   Ht 5\' 4"  (1.626 m)   Wt 132 lb  (59.9 kg)   SpO2 98%   BMI 22.66 kg/m   Physical Exam  65 year old female in no acute distress Cardiovascular exam with regular rate and rhythm, no murmurs appreciated Lungs clear to auscultation bilaterally  Assessment & Plan:   Problem List Items Addressed This Visit       Other   Insomnia - Primary    Refill of amlodipine today Discussed role of CBT and management of insomnia.  She reports that she has worked with a 77 in the past without significant improvement.  Reports that she engages in acupuncture presently Offered referral to alternative psychologist/counselor, declines at this time Plans to continue with acupuncturist      Relevant Orders   VITAMIN D 25 Hydroxy (Vit-D Deficiency, Fractures)   Hypercalcemia    Found to be elevated on prior labs Review of prior evaluation also reveals normal intact PTH.  The combination of these findings suggest hyperparathyroidism as cause of elevated calcium levels Repeat labs today to further assess status      Relevant Orders   Calcium   Erythrocytosis    Found to have elevated RBC and hemoglobin on prior labs Will repeat CBC today in order to monitor status If continuing to have persistent elevation, likely recommend referral to hematology for further evaluation for underlying etiology of laboratory findings      Relevant Orders   CBC with Differential/Platelet    Outpatient Encounter Medications as of 10/08/2020  Medication Sig   Cholecalciferol (VITAMIN D-3) 125 MCG (5000 UT) TABS Take 1 tablet by mouth daily.   fluocinonide ointment (LIDEX) 0.05 % Apply 1 application topically 2 (two) times daily.   Multiple Vitamins-Minerals (CENTRUM ADULTS) TABS    [DISCONTINUED] meloxicam (MOBIC) 7.5 MG tablet Take 1-2 tablets (7.5-15 mg total) by mouth 2 (two) times daily as needed for pain.   [DISCONTINUED] zolpidem (AMBIEN) 5 MG tablet Take 1 tablet (5 mg total) by mouth at bedtime as needed for sleep.    meloxicam (MOBIC) 7.5 MG tablet Take 1-2 tablets (7.5-15 mg total) by mouth 2 (two) times daily as needed for pain.   zolpidem (AMBIEN) 5 MG tablet Take 1 tablet (5 mg total) by mouth at bedtime as needed for sleep.   [DISCONTINUED] Cholecalciferol (VITAMIN D) 50 MCG (2000 UT) CAPS    No facility-administered encounter medications on file as of 10/08/2020.   Spent 45 minutes on this patient encounter, including preparation, chart review, face-to-face counseling with patient and coordination of care, and documentation of encounter  Follow-up: Return in about 4 months (around 02/07/2021) for Follow Up.   Marlena Barbato J De 02/09/2021, MD

## 2020-10-08 NOTE — Assessment & Plan Note (Signed)
Found to have elevated RBC and hemoglobin on prior labs Will repeat CBC today in order to monitor status If continuing to have persistent elevation, likely recommend referral to hematology for further evaluation for underlying etiology of laboratory findings

## 2020-10-08 NOTE — Assessment & Plan Note (Signed)
Found to be elevated on prior labs Review of prior evaluation also reveals normal intact PTH.  The combination of these findings suggest hyperparathyroidism as cause of elevated calcium levels Repeat labs today to further assess status

## 2020-10-08 NOTE — Assessment & Plan Note (Signed)
Refill of amlodipine today Discussed role of CBT and management of insomnia.  She reports that she has worked with a Counsellor in the past without significant improvement.  Reports that she engages in acupuncture presently Offered referral to alternative psychologist/counselor, declines at this time Plans to continue with acupuncturist

## 2020-10-09 LAB — CBC WITH DIFFERENTIAL/PLATELET
Basophils Absolute: 0 10*3/uL (ref 0.0–0.2)
Basos: 1 %
EOS (ABSOLUTE): 0.1 10*3/uL (ref 0.0–0.4)
Eos: 1 %
Hematocrit: 45.2 % (ref 34.0–46.6)
Hemoglobin: 15.5 g/dL (ref 11.1–15.9)
Immature Grans (Abs): 0 10*3/uL (ref 0.0–0.1)
Immature Granulocytes: 0 %
Lymphocytes Absolute: 1.1 10*3/uL (ref 0.7–3.1)
Lymphs: 21 %
MCH: 31.4 pg (ref 26.6–33.0)
MCHC: 34.3 g/dL (ref 31.5–35.7)
MCV: 92 fL (ref 79–97)
Monocytes Absolute: 0.4 10*3/uL (ref 0.1–0.9)
Monocytes: 7 %
Neutrophils Absolute: 3.7 10*3/uL (ref 1.4–7.0)
Neutrophils: 70 %
Platelets: 282 10*3/uL (ref 150–450)
RBC: 4.94 x10E6/uL (ref 3.77–5.28)
RDW: 12.2 % (ref 11.7–15.4)
WBC: 5.3 10*3/uL (ref 3.4–10.8)

## 2020-10-09 LAB — VITAMIN D 25 HYDROXY (VIT D DEFICIENCY, FRACTURES): Vit D, 25-Hydroxy: 64.3 ng/mL (ref 30.0–100.0)

## 2020-10-09 LAB — CALCIUM: Calcium: 11.2 mg/dL — ABNORMAL HIGH (ref 8.7–10.3)

## 2020-10-12 ENCOUNTER — Encounter (HOSPITAL_BASED_OUTPATIENT_CLINIC_OR_DEPARTMENT_OTHER): Payer: Self-pay | Admitting: Family Medicine

## 2020-10-12 DIAGNOSIS — M810 Age-related osteoporosis without current pathological fracture: Secondary | ICD-10-CM

## 2020-10-12 DIAGNOSIS — E213 Hyperparathyroidism, unspecified: Secondary | ICD-10-CM

## 2020-10-29 ENCOUNTER — Other Ambulatory Visit: Payer: Self-pay | Admitting: Obstetrics and Gynecology

## 2020-10-29 DIAGNOSIS — Z1231 Encounter for screening mammogram for malignant neoplasm of breast: Secondary | ICD-10-CM

## 2020-12-07 ENCOUNTER — Encounter: Payer: Self-pay | Admitting: Internal Medicine

## 2020-12-07 ENCOUNTER — Other Ambulatory Visit: Payer: Self-pay

## 2020-12-07 ENCOUNTER — Ambulatory Visit: Payer: BC Managed Care – PPO | Admitting: Internal Medicine

## 2020-12-07 VITALS — BP 126/82 | HR 68 | Ht 64.0 in | Wt 128.0 lb

## 2020-12-07 DIAGNOSIS — M810 Age-related osteoporosis without current pathological fracture: Secondary | ICD-10-CM | POA: Diagnosis not present

## 2020-12-07 DIAGNOSIS — E213 Hyperparathyroidism, unspecified: Secondary | ICD-10-CM

## 2020-12-07 LAB — VITAMIN D 25 HYDROXY (VIT D DEFICIENCY, FRACTURES): VITD: 65.51 ng/mL (ref 30.00–100.00)

## 2020-12-07 LAB — BASIC METABOLIC PANEL
BUN: 21 mg/dL (ref 6–23)
CO2: 30 mEq/L (ref 19–32)
Calcium: 11.5 mg/dL — ABNORMAL HIGH (ref 8.4–10.5)
Chloride: 102 mEq/L (ref 96–112)
Creatinine, Ser: 0.72 mg/dL (ref 0.40–1.20)
GFR: 88.09 mL/min (ref >60.00)
Glucose, Bld: 100 mg/dL — ABNORMAL HIGH (ref 70–99)
Potassium: 4.4 mEq/L (ref 3.5–5.1)
Sodium: 138 mEq/L (ref 135–145)

## 2020-12-07 LAB — ALBUMIN: Albumin: 4.8 g/dL (ref 3.5–5.2)

## 2020-12-07 NOTE — Progress Notes (Signed)
Name: Carla Aguilar  MRN/ DOB: [id number], [birth year]    Age/ Sex: 65 y.o., female    PCP: de [place], Buren Kos, MD   Reason for Endocrinology Evaluation: Hypercalcemia      Date of Initial Endocrinology Evaluation: 12/07/2020     HPI: Carla Aguilar is a 65 y.o. female with a past medical history of Insomnia , IBS and lichensclerosis. The patient presented for initial endocrinology clinic visit on 12/07/2020 for consultative assistance with her Hypercalcemia .   Carla Aguilar indicates that she was first diagnosed with hypercalcemia in 01/2020, at which time serum calcium was 11.5 mg/dL ( non corrected) with an inappropriately normal PTh .    Since that time, she has experienced symptoms of constipation, polyuria, polydipsia. She admits use of over the counter calcium (including supplements, Tums, Rolaids, or other calcium containing antacids), and Vitamin D 3000 iu daily  But no lithium,or  HCTZ.   Thee's no confirmed history of kidney stones, kidney disease, liver disease, granulomatous disease. She has osteoporosis, has had hx of rib fracture after sustaining a fall . Daily dietary calcium intake: 0 servings( she is lactose intolerant). She denies  family history of osteoporosis, parathyroid disease,  Father with hx of thyroid disease.      Pt was diagnosed with osteoporosis:05/2019 with a T-score of -2.6 at the left femoral neck   Menarche at age : does not recall  Menopausal at age : does not recall  Fracture Hx: rib fracture  Hx of HRT: no FH of osteoporosis or hip fracture: no Prior Hx of anti-estrogenic therapy :no  Prior Hx of anti-resorptive therapy : no    She researched Fosamax causes leg pains. She would like to hold of on treatment until 05/2020  for bone density    MVI has calcium 200 mg daily  Vitamin D 3000 iu daily     HISTORY:  Past Medical History:  Past Medical History:  Diagnosis Date   Anxiety    Arthritis    Past Surgical History: No past  surgical history on file.  Social History:  reports that she has never smoked. She has never used smokeless tobacco. She reports current alcohol use of about 14.0 standard drinks per week. She reports that she does not use drugs. Family History: family history includes Arrhythmia in her maternal uncle, mother, and sister; Arthritis in her maternal uncle, mother, paternal uncle, and sister; Cirrhosis in her father; Diabetes in her father; Heart disease in her maternal grandfather, maternal uncle, and mother; Hypertension in her mother; Kidney disease in her father and mother; Kidney failure in her father.   HOME MEDICATIONS: Allergies as of 12/07/2020       Reactions   Sulfa Antibiotics Itching, Rash   Sulfasalazine Itching, Rash        Medication List        Accurate as of December 07, 2020 10:45 AM. If you have any questions, ask your nurse or doctor.          fluocinonide ointment 0.05 % Commonly known as: LIDEX Apply 1 application topically 2 (two) times daily.   meloxicam 7.5 MG tablet Commonly known as: MOBIC Take 1-2 tablets (7.5-15 mg total) by mouth 2 (two) times daily as needed for pain.   MULTIPLE VITAMINS/WOMENS PO Take by mouth. Nature Made What changed: Another medication with the same name was removed. Continue taking this medication, and follow the directions you see here. Changed by: Scarlette Shorts, MD  Vitamin D-3 125 MCG (5000 UT) Tabs Take 1 tablet by mouth daily. Taking 3000 UT   zolpidem 5 MG tablet Commonly known as: AMBIEN Take 1 tablet (5 mg total) by mouth at bedtime as needed for sleep.          REVIEW OF SYSTEMS: A comprehensive ROS was conducted with the patient and is negative except as per HPI     OBJECTIVE:  VS: BP 126/82 (BP Location: Left Arm, Patient Position: Sitting, Cuff Size: Small)   Pulse 68   Ht 5\' 4"  (1.626 m)   Wt 128 lb (58.1 kg)   SpO2 99%   BMI 21.97 kg/m    Wt Readings from Last 3 Encounters:   12/07/20 128 lb (58.1 kg)  10/08/20 132 lb (59.9 kg)  06/23/20 131 lb (59.4 kg)     EXAM: General: Pt appears well and is in NAD  Neck: General: Supple without adenopathy. Thyroid: Thyroid size normal.  No goiter or nodules appreciated. No thyroid bruit.  Lungs: Clear with good BS bilat with no rales, rhonchi, or wheezes  Heart: Auscultation: RRR.  Abdomen: Normoactive bowel sounds, soft, nontender, without masses or organomegaly palpable  Extremities:  BL LE: No pretibial edema normal ROM and strength.  Skin: Hair: Texture and amount normal with gender appropriate distribution Skin Inspection: No rashes Skin Palpation: Skin temperature, texture, and thickness normal to palpation  Mental Status: Judgment, insight: Intact Orientation: Oriented to time, place, and person Mood and affect: No depression, anxiety, or agitation     DATA REVIEWED: Results for Carla Aguilar, Carla Aguilar (MRN Carla Aguilar) as of 12/08/2020 14:57  Ref. Range 12/07/2020 11:09  Sodium Latest Ref Range: 135 - 145 mEq/L 138  Potassium Latest Ref Range: 3.5 - 5.1 mEq/L 4.4  Chloride Latest Ref Range: 96 - 112 mEq/L 102  CO2 Latest Ref Range: 19 - 32 mEq/L 30  Glucose Latest Ref Range: 70 - 99 mg/dL 12/09/2020 (H)  BUN Latest Ref Range: 6 - 23 mg/dL 21  Creatinine Latest Ref Range: 0.40 - 1.20 mg/dL 867  Calcium Latest Ref Range: 8.4 - 10.5 mg/dL 6.72 (H)  Calcium Ionized Latest Ref Range: 4.8 - 5.6 mg/dL 09.4 (H)  Albumin Latest Ref Range: 3.5 - 5.2 g/dL 4.8  GFR Latest Ref Range: >60.00 mL/min 88.09  VITD Latest Ref Range: 30.00 - 100.00 ng/mL 65.51  PTH, Intact Latest Ref Range: 16 - 77 pg/mL 34      ASSESSMENT/PLAN/RECOMMENDATIONS:   Primary Hyperparathyroidism    - Pt will need further evaluation to determine surgical candidacy  - Pt states she had KUB in 01/2020 , she will fax to use  - She was advised to avoid all OTC calcium tablets and to consume 2-3 servings of dietary calcium daily     2. Osteoporosis  :  - Declines anti-resorptive therapy , she had read about Alendronate and is concerned about side effects  - Pt also states she had TFT's though PCP, she would like to fax this to me, I have canceled TFT's today    F/U in 4 months    Signed electronically by: 02/2020, MD  [facility]  [facility] [address] [place] [place], [place] Phone: [phone] FAX: [phone]   CC: de [phone], Raymond J, MD [address] [place] Phone: [phone] Fax: [phone]   Return to Endocrinology clinic as below: Future Appointments  Date Time Provider Department Center  12/14/2020  9:40 AM [facility]  3 [facility]  02/21/2021  9:10 AM [place], Buren Kos, MD [facility]

## 2020-12-07 NOTE — Patient Instructions (Signed)
-   Stay Hydrated  - Avoid all over the counter calcium tablets  - Continue 2-3 servings of dietary calcium daily

## 2020-12-08 ENCOUNTER — Encounter: Payer: Self-pay | Admitting: Internal Medicine

## 2020-12-08 LAB — CALCIUM, IONIZED: Calcium, Ion: 6.02 mg/dL — ABNORMAL HIGH (ref 4.8–5.6)

## 2020-12-08 LAB — PARATHYROID HORMONE, INTACT (NO CA): PTH: 34 pg/mL (ref 16–77)

## 2020-12-09 ENCOUNTER — Encounter (HOSPITAL_BASED_OUTPATIENT_CLINIC_OR_DEPARTMENT_OTHER): Payer: Self-pay | Admitting: Family Medicine

## 2020-12-13 ENCOUNTER — Encounter: Payer: Self-pay | Admitting: Internal Medicine

## 2020-12-14 ENCOUNTER — Ambulatory Visit
Admission: RE | Admit: 2020-12-14 | Discharge: 2020-12-14 | Disposition: A | Discharge status: Home / Self Care | Payer: BC Managed Care – PPO | Source: Ambulatory Visit

## 2020-12-14 ENCOUNTER — Other Ambulatory Visit: Payer: Self-pay

## 2020-12-14 DIAGNOSIS — Z1231 Encounter for screening mammogram for malignant neoplasm of breast: Secondary | ICD-10-CM | POA: Diagnosis not present

## 2020-12-29 ENCOUNTER — Encounter: Payer: Self-pay | Admitting: Internal Medicine

## 2021-01-08 ENCOUNTER — Other Ambulatory Visit (HOSPITAL_BASED_OUTPATIENT_CLINIC_OR_DEPARTMENT_OTHER): Payer: Self-pay | Admitting: Family Medicine

## 2021-01-20 ENCOUNTER — Encounter: Payer: Self-pay | Admitting: Internal Medicine

## 2021-01-26 ENCOUNTER — Ambulatory Visit (HOSPITAL_BASED_OUTPATIENT_CLINIC_OR_DEPARTMENT_OTHER): Payer: BC Managed Care – PPO | Admitting: Family Medicine

## 2021-01-26 ENCOUNTER — Encounter (HOSPITAL_BASED_OUTPATIENT_CLINIC_OR_DEPARTMENT_OTHER): Payer: Self-pay | Admitting: Family Medicine

## 2021-01-26 ENCOUNTER — Other Ambulatory Visit: Payer: Self-pay

## 2021-01-26 DIAGNOSIS — N888 Other specified noninflammatory disorders of cervix uteri: Secondary | ICD-10-CM | POA: Insufficient documentation

## 2021-01-26 DIAGNOSIS — E213 Hyperparathyroidism, unspecified: Secondary | ICD-10-CM

## 2021-01-26 NOTE — Assessment & Plan Note (Addendum)
Patient reports that during recent dental visit, dentist indicated palpating a nodule over left side of posterior neck, just lateral to midline Dental documentation indicates nodule over posterior neck, left side of midline, non-mobile, no specific size mentioned Patient reports having some concern regarding this and her ongoing issues with hyperparathyroidism.  Also reports that her dentist seemed concerned given his personal issues with thyroid problems in the past which increased her own concern On exam, palpation over posterior neck/cervical spine did not reveal any obvious lumps or nodules.  Attempted to have patient show me where dentist was palpating nodule on exam, no obvious abnormality appreciated. Discussed that it is possible the nodule was a small lymph node which was reactive in nature and may have resolved.  No concerning findings on exam today.  Patient does have some apprehension regarding her discussion of the nodule with her dentist Will proceed with initial X-ray imaging to evaluate

## 2021-01-26 NOTE — Progress Notes (Signed)
° ° °  Procedures performed today:    None.  Independent interpretation of notes and tests performed by another provider:   None.  Brief History, Exam, Impression, and Recommendations:    BP 100/72    Pulse 82    Ht 5\' 4"  (1.626 m)    Wt 128 lb (58.1 kg)    SpO2 97%    BMI 21.97 kg/m   Cervical mass Patient reports that during recent dental visit, dentist indicated palpating a nodule over left side of posterior neck, just lateral to midline Dental documentation indicates nodule over posterior neck, left side of midline, non-mobile, no specific size mentioned Patient reports having some concern regarding this and her ongoing issues with hyperparathyroidism.  Also reports that her dentist seemed concerned given his personal issues with thyroid problems in the past which increased her own concern On exam, palpation over posterior neck/cervical spine did not reveal any obvious lumps or nodules.  Attempted to have patient show me where dentist was palpating nodule on exam, no obvious abnormality appreciated. Discussed that it is possible the nodule was a small lymph node which was reactive in nature and may have resolved.  No concerning findings on exam today.  Patient does have some apprehension regarding her discussion of the nodule with her dentist Will proceed with initial X-ray imaging to evaluate  Hyperparathyroidism [facility]) Had initial evaluation with endocrinology, follow-up scheduled for March Was advised on avoiding calcium supplement Recommended to ensure adequate hydration At follow-up visit, patient's candidacy for surgery was to be further reviewed and discussed Recommend continued follow-up with endocrinologist.  Patient does report issues with having some feelings of dehydration anxiety.  Discussed that if she feels that her symptoms have become more pronounced, sooner follow-up with endocrinology would be warranted in this would recommend contacting their office to move appointment  sooner  Plan for follow-up in about 2 to 3 months after endocrinology appointment to review recommendations and to complete CPE   ___________________________________________ Jeremy Mclamb de [place], MD, ABFM, CAQSM Primary Care and Sports Medicine [facility]

## 2021-01-26 NOTE — Patient Instructions (Signed)
°  Medication Instructions:  Your physician recommends that you continue on your current medications as directed. Please refer to the Current Medication list given to you today. --If you need a refill on any your medications before your next appointment, please call your pharmacy first. If no refills are authorized on file call the office.-- Referrals/Procedures/Imaging: Your physician recommends that you have an XRAY of your Cervical Spine. X-rays are a type of radiation called electromagnetic waves. X-ray imaging creates pictures of the inside of your body. The images show the parts of your body in different shades of black and white. This is because different tissues absorb different amounts of radiation. Calcium in bones absorbs x-rays the most, so bones look white. Fat and other soft tissues absorb less and look gray. Air absorbs the least, so lungs look black     1. You may have this done at the [facility], located in the [facility] on the 1st floor.    2. You do no have to have an appointment.    3. [address] [place], [place] [address]        [phone]        Monday - Friday  8:00 am - 5:00 pm 4. [facility]  Follow-Up: Your next appointment:   Your physician recommends that you schedule a follow-up appointment in: [facility] for a CPE with Dr. de [place]  You will receive a text message or e-mail with a link to a survey about your care and experience with [place] today! We would greatly appreciate your feedback!   Thanks for letting us be apart of your health journey!!  Primary Care and Sports Medicine   Dr. Ceasar Mons [place]   We encourage you to activate your patient portal called "MyChart".  Sign up information is provided on this After Visit Summary.  MyChart is used to connect with patients for Virtual Visits (Telemedicine).  Patients are able to view lab/test results, encounter notes, upcoming appointments, etc.  Non-urgent messages  can be sent to your provider as well. To learn more about what you can do with MyChart, please visit --  [email or web address].

## 2021-01-26 NOTE — Assessment & Plan Note (Signed)
Had initial evaluation with endocrinology, follow-up scheduled for March Was advised on avoiding calcium supplement Recommended to ensure adequate hydration At follow-up visit, patient's candidacy for surgery was to be further reviewed and discussed Recommend continued follow-up with endocrinologist.  Patient does report issues with having some feelings of dehydration anxiety.  Discussed that if she feels that her symptoms have become more pronounced, sooner follow-up with endocrinology would be warranted in this would recommend contacting their office to move appointment sooner

## 2021-01-28 ENCOUNTER — Other Ambulatory Visit: Payer: Self-pay

## 2021-01-28 ENCOUNTER — Ambulatory Visit
Admission: RE | Admit: 2021-01-28 | Discharge: 2021-01-28 | Disposition: A | Discharge status: Home / Self Care | Payer: BC Managed Care – PPO | Source: Ambulatory Visit | Attending: Family Medicine | Admitting: Family Medicine

## 2021-01-28 DIAGNOSIS — M47812 Spondylosis without myelopathy or radiculopathy, cervical region: Secondary | ICD-10-CM | POA: Diagnosis not present

## 2021-01-28 DIAGNOSIS — N888 Other specified noninflammatory disorders of cervix uteri: Secondary | ICD-10-CM

## 2021-01-28 DIAGNOSIS — M4312 Spondylolisthesis, cervical region: Secondary | ICD-10-CM | POA: Diagnosis not present

## 2021-02-03 ENCOUNTER — Encounter (HOSPITAL_BASED_OUTPATIENT_CLINIC_OR_DEPARTMENT_OTHER): Payer: Self-pay | Admitting: Family Medicine

## 2021-02-21 ENCOUNTER — Ambulatory Visit (HOSPITAL_BASED_OUTPATIENT_CLINIC_OR_DEPARTMENT_OTHER): Payer: BC Managed Care – PPO | Admitting: Family Medicine

## 2021-02-24 ENCOUNTER — Other Ambulatory Visit: Payer: Self-pay | Admitting: Obstetrics and Gynecology

## 2021-02-24 DIAGNOSIS — M8580 Other specified disorders of bone density and structure, unspecified site: Secondary | ICD-10-CM

## 2021-03-17 DIAGNOSIS — H93A3 Pulsatile tinnitus, bilateral: Secondary | ICD-10-CM | POA: Insufficient documentation

## 2021-03-17 DIAGNOSIS — H93233 Hyperacusis, bilateral: Secondary | ICD-10-CM | POA: Diagnosis not present

## 2021-03-17 DIAGNOSIS — H9313 Tinnitus, bilateral: Secondary | ICD-10-CM | POA: Diagnosis not present

## 2021-03-22 ENCOUNTER — Ambulatory Visit: Payer: BC Managed Care – PPO | Admitting: Internal Medicine

## 2021-03-22 ENCOUNTER — Encounter: Payer: Self-pay | Admitting: Internal Medicine

## 2021-03-22 VITALS — BP 110/70 | HR 74 | Ht 64.0 in | Wt 132.0 lb

## 2021-03-22 DIAGNOSIS — E213 Hyperparathyroidism, unspecified: Secondary | ICD-10-CM

## 2021-03-22 LAB — BASIC METABOLIC PANEL
BUN: 20 mg/dL (ref 6–23)
CO2: 28 mEq/L (ref 19–32)
Calcium: 11.2 mg/dL — ABNORMAL HIGH (ref 8.4–10.5)
Chloride: 104 mEq/L (ref 96–112)
Creatinine, Ser: 0.68 mg/dL (ref 0.40–1.20)
GFR: 91.58 mL/min (ref >60.00)
Glucose, Bld: 101 mg/dL — ABNORMAL HIGH (ref 70–99)
Potassium: 4.9 mEq/L (ref 3.5–5.1)
Sodium: 139 mEq/L (ref 135–145)

## 2021-03-22 LAB — ALBUMIN: Albumin: 4.5 g/dL (ref 3.5–5.2)

## 2021-03-22 LAB — VITAMIN D 25 HYDROXY (VIT D DEFICIENCY, FRACTURES): VITD: 61.07 ng/mL (ref 30.00–100.00)

## 2021-03-22 NOTE — Progress Notes (Signed)
[place]    Name: Carla Aguilar  MRN/ DOB: [id number], 1956-01-04    Age/ Sex: 66 y.o., female    PCP: de [place], Buren Kos, MD   Reason for Endocrinology Evaluation: Hypercalcemia      Date of Initial Endocrinology Evaluation: 12/07/2020    HPI: Carla Aguilar is a 66 y.o. female with a past medical history of Insomnia , IBS and lichensclerosis. The patient presented for initial endocrinology clinic visit on 12/07/2020 for consultative assistance with her Hypercalcemia .   Carla Aguilar indicates that she was first diagnosed with hypercalcemia in 01/2020, at which time serum calcium was 11.5 mg/dL ( non corrected) with an inappropriately normal PTh .   No HCTZ use     Thee's no confirmed history of kidney stones.  She has osteoporosis, has had hx of rib fracture after sustaining a fall , declines anti-resorption therapy .  She denies  family history of osteoporosis, parathyroid disease,  Father with hx of thyroid disease.    OSTEOPOROSIS HISTORY:  Pt was diagnosed with osteoporosis:05/2019 with a T-score of -2.6 at the left femoral neck   Menarche at age : does not recall  Menopausal at age : does not recall  Fracture Hx: rib fracture  Hx of HRT: no FH of osteoporosis or hip fracture: no Prior Hx of anti-estrogenic therapy :no  Prior Hx of anti-resorptive therapy : no    She researched Fosamax causes leg pains. She would like to hold of on treatment until 05/2021  for bone density     SUBJECTIVE:     Today (03/22/21):  Carla Aguilar is here for a follow up on hyperparathyroidism.   She was seen by ENT for tinnitus last week   She is compliant with dietary calcium  She denies renal stones  No  fracture of the bones  She has polydipsia with frequency  Has constipation    She is on MVI with  calcium 100 mg.  Vitamin D3 3000 iu daily   HISTORY:  Past Medical History:  Past Medical History:  Diagnosis Date   Anxiety    Arthritis    Past Surgical History: No  past surgical history on file.  Social History:  reports that she has never smoked. She has never used smokeless tobacco. She reports current alcohol use of about 14.0 standard drinks per week. She reports that she does not use drugs. Family History: family history includes Arrhythmia in her maternal uncle, mother, and sister; Arthritis in her maternal uncle, mother, paternal uncle, and sister; Cirrhosis in her father; Diabetes in her father; Heart disease in her maternal grandfather, maternal uncle, and mother; Hypertension in her mother; Kidney disease in her father and mother; Kidney failure in her father.   HOME MEDICATIONS: Allergies as of 03/22/2021       Reactions   Sulfa Antibiotics Itching, Rash   Sulfasalazine Itching, Rash        Medication List        Accurate as of March 22, 2021 10:31 AM. If you have any questions, ask your nurse or doctor.          fluocinonide ointment 0.05 % Commonly known as: LIDEX Apply 1 application topically 2 (two) times daily.   meloxicam 7.5 MG tablet Commonly known as: MOBIC TAKE 1-2 TABLETS (7.5-15 MG TOTAL) BY MOUTH 2 (TWO) TIMES DAILY AS NEEDED FOR PAIN.   MULTIPLE VITAMINS/WOMENS PO Take by mouth. Nature Made   Vitamin D-3 125 MCG (5000 UT)  Tabs Take 1 tablet by mouth daily. Taking 3000 UT   zolpidem 5 MG tablet Commonly known as: AMBIEN Take 1 tablet (5 mg total) by mouth at bedtime as needed for sleep.          REVIEW OF SYSTEMS: A comprehensive ROS was conducted with the patient and is negative except as per HPI     OBJECTIVE:  VS: BP 110/70 (BP Location: Left Arm, Patient Position: Sitting, Cuff Size: Small)    Pulse 74    Ht 5\' 4"  (1.626 m)    Wt 132 lb (59.9 kg)    SpO2 98%    BMI 22.66 kg/m    Wt Readings from Last 3 Encounters:  03/22/21 132 lb (59.9 kg)  01/26/21 128 lb (58.1 kg)  12/07/20 128 lb (58.1 kg)     EXAM: General: Pt appears well and is in NAD  Neck: General: Supple without  adenopathy. Thyroid: Thyroid size normal.  No goiter or nodules appreciated. No thyroid bruit.  Lungs: Clear with good BS bilat with no rales, rhonchi, or wheezes  Heart: Auscultation: RRR.  Abdomen: Normoactive bowel sounds, soft, nontender, without masses or organomegaly palpable  Extremities:  BL LE: No pretibial edema normal ROM and strength.  Skin: Hair: Texture and amount normal with gender appropriate distribution Skin Inspection: No rashes Skin Palpation: Skin temperature, texture, and thickness normal to palpation  Mental Status: Judgment, insight: Intact Orientation: Oriented to time, place, and person Mood and affect: No depression, anxiety, or agitation     DATA REVIEWED:  Latest Reference Range & Units 03/22/21 10:54  Sodium 135 - 145 mEq/L 139  Potassium 3.5 - 5.1 mEq/L 4.9  Chloride 96 - 112 mEq/L 104  CO2 19 - 32 mEq/L 28  Glucose 70 - 99 mg/dL 101 (H)  BUN 6 - 23 mg/dL 20  Creatinine 0.40 - 1.20 mg/dL 0.68  Calcium 8.4 - 10.5 mg/dL 11.2 (H)  Calcium Ionized 4.8 - 5.6 mg/dL 5.86 (H)  Albumin 3.5 - 5.2 g/dL 4.5  GFR >60.00 mL/min 91.58    Latest Reference Range & Units 03/22/21 10:54  PTH, Intact 16 - 77 pg/mL 47     Abdominal x-ray 12/2021  Mildly dilated loops of small bowel within the upper abdomen with air fluid levels.  Nondilated loops of small bowel within the lower abdomen and pelvis.  There is fecal material throughout the colon and rectum Impression probable upper abdominal small bowel ileus, less likely obstruction.  No suspicious calcifications identified  ASSESSMENT/PLAN/RECOMMENDATIONS:   Primary Hyperparathyroidism    -I have advised the patient to stop multivitamin even if it contains small amounts of calcium -I am also going to reduce vitamin D supplement, goal of vitamin D is~40 -We will proceed with 24-hour urine collection for calcium -We did discuss her candidacy for surgery given a diagnosis of osteoporosis, she is open to  this   Recommendations - She was advised to avoid all OTC calcium tablets  - Consume 2-3 servings of dietary calcium daily -Stay hydrated -Reduce vitamin D 3 2000 IUs daily    2. Osteoporosis :  - Declines anti-resorptive therapy , she had read about Alendronate and is concerned about side effects     F/U in 4 months    Signed electronically by: Mack Guise, MD  [facility] Endocrinology  [facility] [address] [place], [place] [address] Phone: [phone] FAX: [phone]   CC: de [place], Raymond J, [place]  [place] [address] Phone: [phone] Fax: [phone]   Return to Endocrinology clinic as below: Future Appointments  Date Time Provider [place]  03/28/2021  2:30 PM de [place], Blondell Reveal, MD [facility]

## 2021-03-22 NOTE — Patient Instructions (Signed)
-   Stay Hydrated  - Avoid all over the counter calcium tablets  - Continue 2-3 servings of dietary calcium daily    24-Hour Urine Collection  You will be collecting your urine for a 24-hour period of time. Your timer starts with your first urine of the morning (For example - If you first pee at 9AM, your timer will start at 9AM) Throw away your first urine of the morning Collect your urine every time you pee for the next 24 hours STOP your urine collection 24 hours after you started the collection (For example - You would stop at 9AM the day after you started)

## 2021-03-23 LAB — CALCIUM, IONIZED: Calcium, Ion: 5.86 mg/dL — ABNORMAL HIGH (ref 4.8–5.6)

## 2021-03-23 LAB — PARATHYROID HORMONE, INTACT (NO CA): PTH: 47 pg/mL (ref 16–77)

## 2021-03-28 ENCOUNTER — Other Ambulatory Visit: Payer: BC Managed Care – PPO

## 2021-03-28 ENCOUNTER — Ambulatory Visit (INDEPENDENT_AMBULATORY_CARE_PROVIDER_SITE_OTHER): Payer: BC Managed Care – PPO | Admitting: Family Medicine

## 2021-03-28 ENCOUNTER — Encounter (HOSPITAL_BASED_OUTPATIENT_CLINIC_OR_DEPARTMENT_OTHER): Payer: Self-pay | Admitting: Family Medicine

## 2021-03-28 ENCOUNTER — Other Ambulatory Visit: Payer: Self-pay

## 2021-03-28 ENCOUNTER — Ambulatory Visit (HOSPITAL_BASED_OUTPATIENT_CLINIC_OR_DEPARTMENT_OTHER): Payer: BC Managed Care – PPO | Admitting: Family Medicine

## 2021-03-28 VITALS — BP 117/72 | HR 89 | Ht 64.0 in | Wt 130.0 lb

## 2021-03-28 DIAGNOSIS — E213 Hyperparathyroidism, unspecified: Secondary | ICD-10-CM

## 2021-03-28 DIAGNOSIS — Z1211 Encounter for screening for malignant neoplasm of colon: Secondary | ICD-10-CM | POA: Diagnosis not present

## 2021-03-28 DIAGNOSIS — Z0000 Encounter for general adult medical examination without abnormal findings: Secondary | ICD-10-CM

## 2021-03-28 MED ORDER — ZOLPIDEM TARTRATE 5 MG PO TABS: 5.0000 mg | ORAL_TABLET | Freq: Every evening | ORAL | 1 refills | Status: DC | PRN

## 2021-03-28 MED ORDER — MELOXICAM 7.5 MG PO TABS: 7.5000 mg | ORAL_TABLET | Freq: Every day | ORAL | 0 refills | Status: DC

## 2021-03-28 NOTE — Patient Instructions (Signed)
?  Medication Instructions:  ?Your physician recommends that you continue on your current medications as directed. Please refer to the Current Medication list given to you today. ?--If you need a refill on any your medications before your next appointment, please call your pharmacy first. If no refills are authorized on file call the office.-- ?Lab Work: ?Your physician has recommended that you have lab work today: Lipid Panel ?If you have labs (blood work) drawn today and your tests are completely normal, you will receive your results via MyChart message OR a phone call from our staff.  ?Please ensure you check your voicemail in the event that you authorized detailed messages to be left on a delegated number. If you have any lab test that is abnormal or we need to change your treatment, we will call you to review the results. ? ? ? ?Follow-Up: ?Your next appointment:   ?Your physician recommends that you schedule a follow-up appointment in: 4 months with Dr. de [place] ? ?You will receive a text message or e-mail with a link to a survey about your care and experience with [place] today! We would greatly appreciate your feedback!  ? ?Thanks for letting us be apart of your health journey!!  ?Primary Care and Sports Medicine  ? ?Dr. Marcy Salvo de [place]  ? ?We encourage you to activate your patient portal called "MyChart".  Sign up information is provided on this After Visit Summary.  MyChart is used to connect with patients for Virtual Visits (Telemedicine).  Patients are able to view lab/test results, encounter notes, upcoming appointments, etc.  Non-urgent messages can be sent to your provider as well. To learn more about what you can do with MyChart, please visit --  [email or web address].   ? ?

## 2021-03-28 NOTE — Assessment & Plan Note (Addendum)
Routine HCM labs ordered. HCM reviewed/discussed. Anticipatory guidance regarding healthy weight, lifestyle and choices given. ?Recommend healthy diet.  Recommend approximately 150 minutes/week of moderate intensity exercise ?Recommend regular dental and vision exams ?Always use seatbelt/lap and shoulder restraints ?Recommend using smoke alarms and checking batteries at least twice a year ?Recommend using sunscreen when outside ?Patient reports that she has completed pneumococcal vaccine previously, indicates it was done about 1 month ago at [facility] pharmacy ?

## 2021-03-28 NOTE — Progress Notes (Signed)
Total Volume:  ?Start: 03/27/21 @ 6:45am ?End:  03/28/21 @ 6:43am ?

## 2021-03-28 NOTE — Progress Notes (Signed)
?Subjective:   ? ?CC: Annual Physical Exam ? ?HPI:  ?Carla Aguilar is a 66 y.o. presenting for annual physical ? ?I reviewed the past medical history, family history, social history, surgical history, and allergies today and no changes were needed.  Please see the problem list section below in epic for further details. ? ?Past Medical History: ?Past Medical History:  ?Diagnosis Date  ? Anxiety   ? Arthritis   ? ?Past Surgical History: ?History reviewed. No pertinent surgical history. ?Social History: ?Social History  ? ?Socioeconomic History  ? Marital status: Married  ?  Spouse name: Not on file  ? Number of children: Not on file  ? Years of education: Not on file  ? Highest education level: Not on file  ?Occupational History  ? Not on file  ?Tobacco Use  ? Smoking status: Never  ? Smokeless tobacco: Never  ?Substance and Sexual Activity  ? Alcohol use: Yes  ?  Alcohol/week: 14.0 standard drinks  ?  Types: 14 Glasses of wine per week  ? Drug use: Never  ? Sexual activity: Yes  ?  Birth control/protection: None  ?Other Topics Concern  ? Not on file  ?Social History Narrative  ? Not on file  ? ?Social Determinants of Health  ? ?Financial Resource Strain: Not on file  ?Food Insecurity: Not on file  ?Transportation Needs: Not on file  ?Physical Activity: Not on file  ?Stress: Not on file  ?Social Connections: Not on file  ? ?Family History: ?Family History  ?Problem Relation Age of Onset  ? Heart disease Mother   ? Hypertension Mother   ? Arrhythmia Mother   ? Arthritis Mother   ? Kidney disease Mother   ? Cirrhosis Father   ? Kidney failure Father   ? Diabetes Father   ? Kidney disease Father   ? Arthritis Sister   ? Arrhythmia Sister   ? Arrhythmia Maternal Uncle   ? Arthritis Maternal Uncle   ? Heart disease Maternal Grandfather   ? Arthritis Paternal Uncle   ? Heart disease Maternal Uncle   ? Cancer Neg Hx   ? ?Allergies: ?Allergies  ?Allergen Reactions  ? Sulfa Antibiotics Itching and Rash  ? Sulfasalazine  Itching and Rash  ? ?Medications: See med rec. ? ?Review of Systems: No headache, visual changes, nausea, vomiting, diarrhea, constipation, dizziness, abdominal pain, skin rash, fevers, chills, night sweats, swollen lymph nodes, weight loss, chest pain, body aches, joint swelling, muscle aches, shortness of breath, mood changes, visual or auditory hallucinations. ? ?Objective:   ? ?BP 117/72   Pulse 89   Ht 5\' 4"  (1.626 m)   Wt 130 lb (59 kg)   SpO2 98%   BMI 22.31 kg/m?  ? ?General: Well Developed, well nourished, and in no acute distress.  ?Neuro: Alert and oriented x3, extra-ocular muscles intact, sensation grossly intact. Cranial nerves II through XII are intact, motor, sensory, and coordinative functions are all intact. ?HEENT: Normocephalic, atraumatic, pupils equal round reactive to light, neck supple, no masses, no lymphadenopathy, thyroid nonpalpable. Oropharynx, nasopharynx, external ear canals are unremarkable. ?Skin: Warm and dry, no rashes noted.  ?Cardiac: Regular rate and rhythm, no murmurs rubs or gallops.  ?Respiratory: Clear to auscultation bilaterally. Not using accessory muscles, speaking in full sentences.  ?Abdominal: Soft, nontender, nondistended, positive bowel sounds, no masses, no organomegaly.  ?Musculoskeletal: Shoulder, elbow, wrist, hip, knee, ankle stable, and with full range of motion. ? ?Impression and Recommendations:   ? ?Wellness examination ?Routine  HCM labs ordered. HCM reviewed/discussed. Anticipatory guidance regarding healthy weight, lifestyle and choices given. ?Recommend healthy diet.  Recommend approximately 150 minutes/week of moderate intensity exercise ?Recommend regular dental and vision exams ?Always use seatbelt/lap and shoulder restraints ?Recommend using smoke alarms and checking batteries at least twice a year ?Recommend using sunscreen when outside ?Patient reports that she has completed pneumococcal vaccine previously, indicates it was done about 1 month  ago at [facility] ?Also recommend tetanus booster, shingles vaccine, however patient indicates that she is reluctant to proceed with immunizations given reported prior reactions, primarily referencing allover nerve pain when receiving vaccines at times ? ? ?___________________________________________ ?Daysha Ashmore [place], MD, ABFM, CAQSM ?Primary Care and Sports Medicine ?[place] ?

## 2021-03-29 ENCOUNTER — Encounter (HOSPITAL_BASED_OUTPATIENT_CLINIC_OR_DEPARTMENT_OTHER): Payer: Self-pay | Admitting: Family Medicine

## 2021-03-29 LAB — CALCIUM, URINE, 24 HOUR: Calcium, 24H Urine: 303 mg/24 h — ABNORMAL HIGH

## 2021-03-29 LAB — CREATININE, URINE, 24 HOUR: Creatinine, 24H Ur: 1.08 g/(24.h) (ref 0.50–2.15)

## 2021-03-30 ENCOUNTER — Other Ambulatory Visit: Payer: Self-pay | Admitting: Internal Medicine

## 2021-03-30 ENCOUNTER — Encounter: Payer: Self-pay | Admitting: Internal Medicine

## 2021-03-30 DIAGNOSIS — E213 Hyperparathyroidism, unspecified: Secondary | ICD-10-CM

## 2021-03-31 DIAGNOSIS — Z124 Encounter for screening for malignant neoplasm of cervix: Secondary | ICD-10-CM | POA: Diagnosis not present

## 2021-03-31 DIAGNOSIS — Z6823 Body mass index (BMI) 23.0-23.9, adult: Secondary | ICD-10-CM | POA: Diagnosis not present

## 2021-03-31 DIAGNOSIS — Z01419 Encounter for gynecological examination (general) (routine) without abnormal findings: Secondary | ICD-10-CM | POA: Diagnosis not present

## 2021-04-01 ENCOUNTER — Other Ambulatory Visit: Payer: Self-pay

## 2021-04-01 ENCOUNTER — Ambulatory Visit (HOSPITAL_BASED_OUTPATIENT_CLINIC_OR_DEPARTMENT_OTHER)
Admission: RE | Admit: 2021-04-01 | Discharge: 2021-04-01 | Disposition: A | Discharge status: Home / Self Care | Payer: BC Managed Care – PPO | Source: Ambulatory Visit | Attending: Internal Medicine | Admitting: Internal Medicine

## 2021-04-01 DIAGNOSIS — E213 Hyperparathyroidism, unspecified: Secondary | ICD-10-CM

## 2021-04-01 DIAGNOSIS — E042 Nontoxic multinodular goiter: Secondary | ICD-10-CM | POA: Diagnosis not present

## 2021-04-05 ENCOUNTER — Ambulatory Visit: Payer: BC Managed Care – PPO | Admitting: Internal Medicine

## 2021-04-05 DIAGNOSIS — Z0000 Encounter for general adult medical examination without abnormal findings: Secondary | ICD-10-CM | POA: Diagnosis not present

## 2021-04-06 LAB — LIPID PANEL
Chol/HDL Ratio: 2.4 ratio (ref 0.0–4.4)
Cholesterol, Total: 231 mg/dL — ABNORMAL HIGH (ref 100–199)
HDL: 97 mg/dL (ref >39)
LDL Chol Calc (NIH): 123 mg/dL — ABNORMAL HIGH (ref 0–99)
Triglycerides: 64 mg/dL (ref 0–149)
VLDL Cholesterol Cal: 11 mg/dL (ref 5–40)

## 2021-04-06 LAB — TSH RFX ON ABNORMAL TO FREE T4: TSH: 1.64 u[IU]/mL (ref 0.450–4.500)

## 2021-05-02 ENCOUNTER — Other Ambulatory Visit (HOSPITAL_BASED_OUTPATIENT_CLINIC_OR_DEPARTMENT_OTHER): Payer: Self-pay | Admitting: Family Medicine

## 2021-05-02 MED ORDER — MELOXICAM 7.5 MG PO TABS: 7.5000 mg | ORAL_TABLET | Freq: Every day | ORAL | 0 refills | Status: DC

## 2021-05-02 MED ORDER — ZOLPIDEM TARTRATE 5 MG PO TABS
5.0000 mg | ORAL_TABLET | Freq: Every evening | ORAL | 0 refills | Status: DC | PRN
Start: 2021-05-02 — End: 2021-06-15

## 2021-05-12 ENCOUNTER — Encounter: Payer: Self-pay | Admitting: Internal Medicine

## 2021-05-12 ENCOUNTER — Other Ambulatory Visit: Payer: Self-pay | Admitting: Internal Medicine

## 2021-05-12 DIAGNOSIS — E213 Hyperparathyroidism, unspecified: Secondary | ICD-10-CM

## 2021-05-16 DIAGNOSIS — Z1211 Encounter for screening for malignant neoplasm of colon: Secondary | ICD-10-CM | POA: Diagnosis not present

## 2021-05-25 LAB — COLOGUARD: COLOGUARD: NEGATIVE

## 2021-06-11 ENCOUNTER — Other Ambulatory Visit (HOSPITAL_BASED_OUTPATIENT_CLINIC_OR_DEPARTMENT_OTHER): Payer: Self-pay | Admitting: Family Medicine

## 2021-07-10 ENCOUNTER — Encounter: Payer: Self-pay | Admitting: Internal Medicine

## 2021-07-10 DIAGNOSIS — M816 Localized osteoporosis [Lequesne]: Secondary | ICD-10-CM

## 2021-07-10 DIAGNOSIS — E213 Hyperparathyroidism, unspecified: Secondary | ICD-10-CM

## 2021-07-11 ENCOUNTER — Telehealth: Payer: Self-pay | Admitting: Internal Medicine

## 2021-07-11 NOTE — Telephone Encounter (Signed)
[facility] called this morning. This patient was referred to them for a surgical consult and they are seeing her 07/14/21, they have not received the most recent notes, labs, & any associated images for this visit. They are requesting that they be sent by fax to [phone]. Message sent via secure chat to [employer] and Dorcas Mcmurray

## 2021-07-12 NOTE — Telephone Encounter (Signed)
Paperwork was faxed to number below

## 2021-07-13 ENCOUNTER — Other Ambulatory Visit (INDEPENDENT_AMBULATORY_CARE_PROVIDER_SITE_OTHER): Payer: BC Managed Care – PPO

## 2021-07-13 DIAGNOSIS — E213 Hyperparathyroidism, unspecified: Secondary | ICD-10-CM

## 2021-07-13 DIAGNOSIS — M816 Localized osteoporosis [Lequesne]: Secondary | ICD-10-CM

## 2021-07-13 LAB — BASIC METABOLIC PANEL
BUN: 18 mg/dL (ref 6–23)
CO2: 31 mEq/L (ref 19–32)
Calcium: 10.6 mg/dL — ABNORMAL HIGH (ref 8.4–10.5)
Chloride: 101 mEq/L (ref 96–112)
Creatinine, Ser: 0.68 mg/dL (ref 0.40–1.20)
GFR: 91.38 mL/min (ref >60.00)
Glucose, Bld: 97 mg/dL (ref 70–99)
Potassium: 4.7 mEq/L (ref 3.5–5.1)
Sodium: 136 mEq/L (ref 135–145)

## 2021-07-13 LAB — T4, FREE: Free T4: 0.83 ng/dL (ref 0.60–1.60)

## 2021-07-13 LAB — VITAMIN D 25 HYDROXY (VIT D DEFICIENCY, FRACTURES): VITD: 70.56 ng/mL (ref 30.00–100.00)

## 2021-07-13 LAB — TSH: TSH: 2.14 u[IU]/mL (ref 0.35–5.50)

## 2021-07-13 LAB — ALBUMIN: Albumin: 4.3 g/dL (ref 3.5–5.2)

## 2021-07-14 DIAGNOSIS — E213 Hyperparathyroidism, unspecified: Secondary | ICD-10-CM | POA: Diagnosis not present

## 2021-07-14 LAB — CALCIUM, IONIZED: Calcium, Ion: 5.6 mg/dL — ABNORMAL HIGH (ref 4.7–5.5)

## 2021-07-14 LAB — PARATHYROID HORMONE, INTACT (NO CA): PTH: 70 pg/mL (ref 16–77)

## 2021-07-21 ENCOUNTER — Ambulatory Visit: Payer: BC Managed Care – PPO | Admitting: Internal Medicine

## 2021-07-21 ENCOUNTER — Encounter: Payer: Self-pay | Admitting: Internal Medicine

## 2021-07-21 VITALS — BP 124/72 | HR 66 | Ht 64.0 in | Wt 133.8 lb

## 2021-07-21 DIAGNOSIS — E213 Hyperparathyroidism, unspecified: Secondary | ICD-10-CM

## 2021-07-21 DIAGNOSIS — M816 Localized osteoporosis [Lequesne]: Secondary | ICD-10-CM | POA: Diagnosis not present

## 2021-07-21 NOTE — Progress Notes (Signed)
Name: Carla Aguilar  MRN/ DOB: [id number], [birth year]    Age/ Sex: 66 y.o., female    PCP: [place], Blondell Reveal, MD   Reason for Endocrinology Evaluation: Hypercalcemia      Date of Initial Endocrinology Evaluation: 12/07/2020    HPI: Ms. Carla Aguilar is a 66 y.o. female with a past medical history of Insomnia , IBS and lichensclerosis. The patient presented for initial endocrinology clinic visit on 12/07/2020 for consultative assistance with her Hypercalcemia .   Ms. Laske indicates that she was first diagnosed with hypercalcemia in 01/2020, at which time serum calcium was 11.5 mg/dL ( non corrected) with an inappropriately normal PTh .   No HCTZ use     Thee's no confirmed history of kidney stones.  She has osteoporosis, has had hx of rib fracture after sustaining a fall , declines anti-resorption therapy .  She denies  family history of osteoporosis, parathyroid disease,  Father with hx of thyroid disease.    OSTEOPOROSIS HISTORY:  Pt was diagnosed with osteoporosis:05/2019 with a T-score of -2.6 at the left femoral neck   Menarche at age : does not recall  Menopausal at age : does not recall  Fracture Hx: rib fracture  Hx of HRT: no FH of osteoporosis or hip fracture: no Prior Hx of anti-estrogenic therapy :no  Prior Hx of anti-resorptive therapy : no    She researched Fosamax causes leg pains. She would like to hold of on treatment until 05/2021  for bone density     SUBJECTIVE:     Today (07/21/21):  Carla Aguilar is here for a follow up on hyperparathyroidism.   She was evaluated by general surgeon Dr. Cam Hai on 07/14/2021.  Thyroid ultrasound was performed without any evidence of a dominant thyroid or parathyroid adenoma.  A CT scan have been ordered through surgery for operative planning   She is compliant with dietary calcium  She denies renal stones  No  fracture of the bones  She continues with   polydipsia with frequency  Stable   constipation    DXA  8/10th  Parathyroid scan 7/5th   Vitamin D3 2000 iu daily   HISTORY:  Past Medical History:  Past Medical History:  Diagnosis Date   Anxiety    Arthritis    Past Surgical History: No past surgical history on file.  Social History:  reports that she has never smoked. She has never used smokeless tobacco. She reports current alcohol use of about 14.0 standard drinks of alcohol per week. She reports that she does not use drugs. Family History: family history includes Arrhythmia in her maternal uncle, mother, and sister; Arthritis in her maternal uncle, mother, paternal uncle, and sister; Cirrhosis in her father; Diabetes in her father; Heart disease in her maternal grandfather, maternal uncle, and mother; Hypertension in her mother; Kidney disease in her father and mother; Kidney failure in her father.   HOME MEDICATIONS: Allergies as of 07/21/2021       Reactions   Sulfa Antibiotics Itching, Rash   Sulfasalazine Itching, Rash        Medication List        Accurate as of July 21, 2021 10:55 AM. If you have any questions, ask your nurse or doctor.          fluocinonide ointment 0.05 % Commonly known as: LIDEX Apply 1 application topically 2 (two) times daily.   Magnesium Glycinate 665 MG Caps Take 1 capsule by mouth  daily.   meloxicam 7.5 MG tablet Commonly known as: MOBIC Take 1 tablet (7.5 mg total) by mouth daily.   MULTIPLE VITAMINS/WOMENS PO Take by mouth. Nature Made   Theanine 100 MG Caps Take 1 tablet by mouth daily at 6 (six) AM.   Vitamin D 50 MCG (2000 UT) Caps Take 1 tablet by mouth daily at 6 (six) AM. What changed: Another medication with the same name was removed. Continue taking this medication, and follow the directions you see here. Changed by: Dorita Sciara, MD   zolpidem 5 MG tablet Commonly known as: AMBIEN TAKE 1 TABLET BY MOUTH AT BEDTIME AS NEEDED FOR SLEEP.          REVIEW OF SYSTEMS: A  comprehensive ROS was conducted with the patient and is negative except as per HPI     OBJECTIVE:  VS: BP 124/72 (BP Location: Left Arm, Patient Position: Sitting, Cuff Size: Small)   Pulse 66   Ht _0  (1.626 m)   Wt 133 lb 12.8 oz (60.7 kg)   SpO2 95%   BMI 22.97 kg/m    Wt Readings from Last 3 Encounters:  07/21/21 133 lb 12.8 oz (60.7 kg)  03/28/21 130 lb (59 kg)  03/22/21 132 lb (59.9 kg)     EXAM: General: Pt appears well and is in NAD  Neck: General: Supple without adenopathy. Thyroid: Thyroid size normal.  No goiter or nodules appreciated.   Lungs: Clear with good BS bilat with no rales, rhonchi, or wheezes  Heart: Auscultation: RRR.  Abdomen: Normoactive bowel sounds, soft, nontender, without masses or organomegaly palpable  Extremities:  BL LE: No pretibial edema normal ROM and strength.  Mental Status: Judgment, insight: Intact Orientation: Oriented to time, place, and person Mood and affect: No depression, anxiety, or agitation     DATA REVIEWED:  Latest Reference Range & Units 07/13/21 07:41  Sodium 135 - 145 mEq/L 136  Potassium 3.5 - 5.1 mEq/L 4.7  Chloride 96 - 112 mEq/L 101  CO2 19 - 32 mEq/L 31  Glucose 70 - 99 mg/dL 97  BUN 6 - 23 mg/dL 18  Creatinine 0.40 - 1.20 mg/dL 0.68  Calcium 8.4 - 10.5 mg/dL 10.6 (H)  Calcium Ionized 4.7 - 5.5 mg/dL 5.6 (H)  Albumin 3.5 - 5.2 g/dL 4.3  GFR >60.00 mL/min 91.38    Latest Reference Range & Units 07/13/21 07:41  VITD 30.00 - 100.00 ng/mL 70.56    Latest Reference Range & Units 07/13/21 07:41  PTH, Intact 16 - 77 pg/mL 70  TSH 0.35 - 5.50 uIU/mL 2.14  T4,Free(Direct) 0.60 - 1.60 ng/dL 0.83     Latest Reference Range & Units 03/28/21 09:12  Calcium, 24H Urine mg/24 h 303 (H)      Abdominal x-ray 12/2021  Mildly dilated loops of small bowel within the upper abdomen with air fluid levels.  Nondilated loops of small bowel within the lower abdomen and pelvis.  There is fecal material throughout the  colon and rectum Impression probable upper abdominal small bowel ileus, less likely obstruction.  No suspicious calcifications identified  ASSESSMENT/PLAN/RECOMMENDATIONS:   Primary Hyperparathyroidism    - Pt met surgical criteria for parathyroidectomy due to Osteoporosis and hypercalciuria 303 mg in 24 hr  - She met with the surgeon and is scheduled for parathyroid scan 7/5th . She is not willing to have 3.5 parathyroid glands removed if necessary and may opt for medical management should no adenoma is detected  - She is wondering  if her polyuria and polydipsia would resolve after surgery, she also read that 70 % of people feel better after sx and she is disappointment with this data but I explained to the pt that symptoms are NOT part of the surgical criteria as there are other causes  -I am also going to reduce vitamin D supplement, goal of vitamin D is~40 -We again discussed the purpose of sx is to prevent worsening osteoporosis , bone fracture and to prevent CKD, nephrocalcinosis or kidney stones    Recommendations - She was advised to avoid all OTC calcium tablets  - Consume 2-3 servings of dietary calcium daily -Stay hydrated -Reduce vitamin D3 1000 IUs daily    2. Osteoporosis :  - She had declined anti-resorptive therapy , she had read about Alendronate and is concerned about side effects  - Today she is open to taking Zoledronic acid if needed as she has GERD symptoms and would like to avoid bisphosphate therapy     F/U in 6 months    Signed electronically by: Mack Guise, MD  [facility] Endocrinology  [address] [place], [place] [address] Phone: [phone] FAX: [phone]   CC: de [place], [place], MD [address] [place] [address] Phone: [phone] Fax: [phone]   Return to Endocrinology clinic as below: Future Appointments  Date Time Provider [place]  07/28/2021  9:30 AM de  [place], Raymond J, MD [facility]  09/01/2021  3:00 PM [facility] DX DEXA 1 [facility]

## 2021-07-21 NOTE — Patient Instructions (Addendum)
-   Stay Hydrated  - Avoid all over the counter calcium tablets  - Continue 2-3 servings of dietary calcium daily  - Decrease Vitamin D 1000 iu daily

## 2021-07-27 DIAGNOSIS — E213 Hyperparathyroidism, unspecified: Secondary | ICD-10-CM | POA: Diagnosis not present

## 2021-07-27 DIAGNOSIS — E210 Primary hyperparathyroidism: Secondary | ICD-10-CM | POA: Diagnosis not present

## 2021-07-27 DIAGNOSIS — E214 Other specified disorders of parathyroid gland: Secondary | ICD-10-CM | POA: Diagnosis not present

## 2021-07-28 ENCOUNTER — Ambulatory Visit (HOSPITAL_BASED_OUTPATIENT_CLINIC_OR_DEPARTMENT_OTHER): Payer: BC Managed Care – PPO | Admitting: Family Medicine

## 2021-07-28 ENCOUNTER — Encounter (HOSPITAL_BASED_OUTPATIENT_CLINIC_OR_DEPARTMENT_OTHER): Payer: Self-pay | Admitting: Family Medicine

## 2021-07-28 VITALS — BP 102/71 | HR 71 | Ht 64.0 in | Wt 133.5 lb

## 2021-07-28 DIAGNOSIS — E213 Hyperparathyroidism, unspecified: Secondary | ICD-10-CM

## 2021-07-28 NOTE — Progress Notes (Signed)
    Procedures performed today:    None.  Independent interpretation of notes and tests performed by another provider:   None.  Brief History, Exam, Impression, and Recommendations:    BP 102/71   Pulse 71   Ht _0  (1.626 m)   Wt 133 lb 8 oz (60.6 kg)   SpO2 100%   BMI 22.92 kg/m   Hyperparathyroidism ([place]) Patient has met with endocrine surgeon and discussed indications and recommendations pertaining to parathyroidectomy.  She did have initial testing performed which did not show specific parathyroid gland enlargement.  She did recently have additional imaging performed a CT scan for surgical planning reasons.  She will be following up with endocrine surgeon to discuss these results as well as to decide upon surgical intervention for medical management.  She also continues to follow with endocrinology who saw her about 1 week ago.  Recommendations were made to adjust vitamin D supplement to try to have vitamin D level closer to 40. She continues to have some hesitancy regarding surgical intervention and plans to ultimately make decision after discussing results of recent testing with surgeon. She has follow-up scheduled with endocrinology in about 6 months Did discuss underlying concerns related to her hyperparathyroidism which would be most adequately addressed with parathyroidectomy, particularly related to her underlying osteoporosis.  We will plan to follow-up in about 6 months to review progress and interventions  Return in about 6 months (around 01/28/2022) for Hyperparathyroidism.   ___________________________________________ Carla Mcgillis de [place], MD, ABFM, CAQSM Primary Care and [place]

## 2021-07-28 NOTE — Patient Instructions (Addendum)
  Medication Instructions:  Your physician recommends that you continue on your current medications as directed. Please refer to the Current Medication list given to you today. --If you need a refill on any your medications before your next appointment, please call your pharmacy first. If no refills are authorized on file call the office.-- Lab Work: Your physician has recommended that you have lab work today: No If you have labs (blood work) drawn today and your tests are completely normal, you will receive your results via MyChart message OR a phone call from our staff.  Please ensure you check your voicemail in the event that you authorized detailed messages to be left on a delegated number. If you have any lab test that is abnormal or we need to change your treatment, we will call you to review the results.  Referrals/Procedures/Imaging: No  Follow-Up: Your next appointment:   Your physician recommends that you schedule a follow-up appointment in: 6 months with Dr. de [place].  You will receive a text message or e-mail with a link to a survey about your care and experience with us today! We would greatly appreciate your feedback!   Thanks for letting us be apart of your health journey!!  Primary Care and Sports Medicine   Dr. Raymond [place]   We encourage you to activate your patient portal called "MyChart".  Sign up information is provided on this After Visit Summary.  MyChart is used to connect with patients for Virtual Visits (Telemedicine).  Patients are able to view lab/test results, encounter notes, upcoming appointments, etc.  Non-urgent messages can be sent to your provider as well. To learn more about what you can do with MyChart, please visit --  [email or web address].    

## 2021-07-28 NOTE — Assessment & Plan Note (Signed)
Patient has met with endocrine surgeon and discussed indications and recommendations pertaining to parathyroidectomy.  She did have initial testing performed which did not show specific parathyroid gland enlargement.  She did recently have additional imaging performed a CT scan for surgical planning reasons.  She will be following up with endocrine surgeon to discuss these results as well as to decide upon surgical intervention for medical management.  She also continues to follow with endocrinology who saw her about 1 week ago.  Recommendations were made to adjust vitamin D supplement to try to have vitamin D level closer to 40. She continues to have some hesitancy regarding surgical intervention and plans to ultimately make decision after discussing results of recent testing with surgeon. She has follow-up scheduled with endocrinology in about 6 months Did discuss underlying concerns related to her hyperparathyroidism which would be most adequately addressed with parathyroidectomy, particularly related to her underlying osteoporosis.  We will plan to follow-up in about 6 months to review progress and interventions

## 2021-08-01 ENCOUNTER — Encounter: Payer: Self-pay | Admitting: Internal Medicine

## 2021-09-01 ENCOUNTER — Ambulatory Visit
Admission: RE | Admit: 2021-09-01 | Discharge: 2021-09-01 | Disposition: A | Discharge status: Home / Self Care | Payer: BC Managed Care – PPO | Source: Ambulatory Visit | Attending: Obstetrics and Gynecology | Admitting: Obstetrics and Gynecology

## 2021-09-01 DIAGNOSIS — Z780 Asymptomatic menopausal state: Secondary | ICD-10-CM | POA: Diagnosis not present

## 2021-09-01 DIAGNOSIS — M85832 Other specified disorders of bone density and structure, left forearm: Secondary | ICD-10-CM | POA: Diagnosis not present

## 2021-09-01 DIAGNOSIS — M8580 Other specified disorders of bone density and structure, unspecified site: Secondary | ICD-10-CM

## 2021-09-01 DIAGNOSIS — M810 Age-related osteoporosis without current pathological fracture: Secondary | ICD-10-CM | POA: Diagnosis not present

## 2021-09-04 ENCOUNTER — Other Ambulatory Visit (HOSPITAL_BASED_OUTPATIENT_CLINIC_OR_DEPARTMENT_OTHER): Payer: Self-pay | Admitting: Family Medicine

## 2021-09-04 ENCOUNTER — Encounter: Payer: Self-pay | Admitting: Internal Medicine

## 2021-09-15 ENCOUNTER — Ambulatory Visit (INDEPENDENT_AMBULATORY_CARE_PROVIDER_SITE_OTHER): Payer: BC Managed Care – PPO

## 2021-09-15 ENCOUNTER — Ambulatory Visit (INDEPENDENT_AMBULATORY_CARE_PROVIDER_SITE_OTHER): Payer: BC Managed Care – PPO | Admitting: Family Medicine

## 2021-09-15 ENCOUNTER — Encounter (HOSPITAL_BASED_OUTPATIENT_CLINIC_OR_DEPARTMENT_OTHER): Payer: Self-pay | Admitting: Family Medicine

## 2021-09-15 DIAGNOSIS — M25562 Pain in left knee: Secondary | ICD-10-CM | POA: Diagnosis not present

## 2021-09-15 DIAGNOSIS — M7630 Iliotibial band syndrome, unspecified leg: Secondary | ICD-10-CM

## 2021-09-15 DIAGNOSIS — M1712 Unilateral primary osteoarthritis, left knee: Secondary | ICD-10-CM

## 2021-09-15 NOTE — Patient Instructions (Signed)
  Medication Instructions:  Your physician recommends that you continue on your current medications as directed. Please refer to the Current Medication list given to you today. --If you need a refill on any your medications before your next appointment, please call your pharmacy first. If no refills are authorized on file call the office.-- Lab Work: Your physician has recommended that you have lab work today: No If you have labs (blood work) drawn today and your tests are completely normal, you will receive your results via MyChart message OR a phone call from our staff.  Please ensure you check your voicemail in the event that you authorized detailed messages to be left on a delegated number. If you have any lab test that is abnormal or we need to change your treatment, we will call you to review the results.  Referrals/Procedures/Imaging: X-Rays  Follow-Up: Your next appointment:   Your physician recommends that you schedule a follow-up appointment as needed with Dr. de [place].  You will receive a text message or e-mail with a link to a survey about your care and experience with [place] today! We would greatly appreciate your feedback!   Thanks for letting us be apart of your health journey!!  Primary Care and Sports Medicine   Dr. Ceasar Mons [place]   We encourage you to activate your patient portal called "MyChart".  Sign up information is provided on this After Visit Summary.  MyChart is used to connect with patients for Virtual Visits (Telemedicine).  Patients are able to view lab/test results, encounter notes, upcoming appointments, etc.  Non-urgent messages can be sent to your provider as well. To learn more about what you can do with MyChart, please visit --  [email or web address].

## 2021-09-15 NOTE — Progress Notes (Signed)
    Procedures performed today:    None.  Independent interpretation of notes and tests performed by another provider:   None.  Brief History, Exam, Impression, and Recommendations:    BP 124/78   Pulse 73   Ht 5\' 4"  (1.626 m)   Wt 135 lb (61.2 kg)   SpO2 100%   BMI 23.17 kg/m   Osteoarthritis of left knee Patient presents for evaluation of left knee pain.  She reports that in recent weeks, she has been noticing an increase in pain, thinks that she may have some swelling.  Feels as though she has something behind her knee.  She does report having chronic intermittent issues with knee in the past and previously has had evaluation with orthopedic provider with [facility].  Last x-rays were performed with them about 2 years ago and report indicates evidence of joint space narrowing, arthritis.  She has also seen Dr. most recently related to left knee pain and did complete series of viscosupplementation injections.  Additionally, she has tried steroid injections in the past.  She reports that the steroid injections have provided minimal benefit, but viscosupplementation has been very helpful in controlling her symptoms.  Has had several months to 1 year improvement in symptoms with prior viscosupplementation injections.  Unfortunately, she indicates that subsequent attempts to obtain approval for viscosupplementation has been unsuccessful.  Pain is mostly along anterior joint line, worse with certain activities such as going up stairs.  She does not Nestl have any worsening with level ground or walking. On exam, Left knee: No obvious deformity. No effusion.  Negative patellar grind.  Mild crepitus. Full ROM for flexion and extension.  Strength 5 out of 5 for flexion and extension.  She does have some tenderness to palpation at distal IT band over lateral left knee. Lachman: Negative Varus stress test: Negative Valgus stress test: Negative Neurovascularly intact.  No evidence of lymphatic  disease. Feel that current symptoms are related to a few factors including underlying knee osteoarthritis, patellofemoral pain syndrome, IT band syndrome.  We will proceed with obtaining updated x-ray imaging given that has been a little over 2 years since her last x-rays were completed Feel that she would benefit from working with physical therapy, home exercises, injection therapy.  Given that she has not had good response to steroid injection in the past, but did respond well to viscosupplementation, we can look to arrange for viscosupplementation.  We will need to look to obtain authorization through her insurance company and then arrange for injection procedure visits once authorization is obtained. For now, she would prefer to hold off on referral to physical therapy, advised that she can let [place] know at any time if she would like to proceed with this and we can place referral Plan for follow-up as needed, will arrange for future procedural visits as above   ___________________________________________ Carla Stanek [place], MD, ABFM, CAQSM Primary Care and Sports Medicine [facility]

## 2021-09-15 NOTE — Assessment & Plan Note (Signed)
Patient presents for evaluation of left knee pain.  She reports that in recent weeks, she has been noticing an increase in pain, thinks that she may have some swelling.  Feels as though she has something behind her knee.  She does report having chronic intermittent issues with knee in the past and previously has had evaluation with orthopedic provider with [facility].  Last x-rays were performed with them about 2 years ago and report indicates evidence of joint space narrowing, arthritis.  She has also seen Dr. Prince Rome most recently related to left knee pain and did complete series of viscosupplementation injections.  Additionally, she has tried steroid injections in the past.  She reports that the steroid injections have provided minimal benefit, but viscosupplementation has been very helpful in controlling her symptoms.  Has had several months to 1 year improvement in symptoms with prior viscosupplementation injections.  Unfortunately, she indicates that subsequent attempts to obtain approval for viscosupplementation has been unsuccessful.  Pain is mostly along anterior joint line, worse with certain activities such as going up stairs.  She does not Nestl have any worsening with level ground or walking. On exam, Left knee: No obvious deformity. No effusion.  Negative patellar grind.  Mild crepitus. Full ROM for flexion and extension.  Strength 5 out of 5 for flexion and extension.  She does have some tenderness to palpation at distal IT band over lateral left knee. Lachman: Negative Varus stress test: Negative Valgus stress test: Negative Neurovascularly intact.  No evidence of lymphatic disease. Feel that current symptoms are related to a few factors including underlying knee osteoarthritis, patellofemoral pain syndrome, IT band syndrome.  We will proceed with obtaining updated x-ray imaging given that has been a little over 2 years since her last x-rays were completed Feel that she would benefit from  working with physical therapy, home exercises, injection therapy.  Given that she has not had good response to steroid injection in the past, but did respond well to viscosupplementation, we can look to arrange for viscosupplementation.  We will need to look to obtain authorization through her insurance company and then arrange for injection procedure visits once authorization is obtained. For now, she would prefer to hold off on referral to physical therapy, advised that she can let us know at any time if she would like to proceed with this and we can place referral Plan for follow-up as needed, will arrange for future procedural visits as above

## 2021-10-17 DIAGNOSIS — Z791 Long term (current) use of non-steroidal anti-inflammatories (NSAID): Secondary | ICD-10-CM | POA: Diagnosis not present

## 2021-10-17 DIAGNOSIS — G8929 Other chronic pain: Secondary | ICD-10-CM | POA: Diagnosis not present

## 2021-10-17 DIAGNOSIS — K219 Gastro-esophageal reflux disease without esophagitis: Secondary | ICD-10-CM | POA: Diagnosis not present

## 2021-10-17 DIAGNOSIS — E210 Primary hyperparathyroidism: Secondary | ICD-10-CM | POA: Diagnosis not present

## 2021-10-27 ENCOUNTER — Encounter: Payer: Self-pay | Admitting: Internal Medicine

## 2021-10-31 ENCOUNTER — Other Ambulatory Visit: Payer: Self-pay | Admitting: Obstetrics and Gynecology

## 2021-10-31 ENCOUNTER — Encounter (HOSPITAL_BASED_OUTPATIENT_CLINIC_OR_DEPARTMENT_OTHER): Payer: Self-pay | Admitting: Family Medicine

## 2021-10-31 ENCOUNTER — Ambulatory Visit (INDEPENDENT_AMBULATORY_CARE_PROVIDER_SITE_OTHER): Payer: BC Managed Care – PPO | Admitting: Family Medicine

## 2021-10-31 VITALS — BP 111/77 | HR 60 | Ht 64.0 in | Wt 132.0 lb

## 2021-10-31 DIAGNOSIS — R319 Hematuria, unspecified: Secondary | ICD-10-CM

## 2021-10-31 DIAGNOSIS — Z1231 Encounter for screening mammogram for malignant neoplasm of breast: Secondary | ICD-10-CM

## 2021-10-31 DIAGNOSIS — E860 Dehydration: Secondary | ICD-10-CM | POA: Diagnosis not present

## 2021-10-31 LAB — POCT URINALYSIS DIPSTICK
Bilirubin, UA: NEGATIVE
Glucose, UA: NEGATIVE
Ketones, UA: NEGATIVE
Leukocytes, UA: NEGATIVE
Nitrite, UA: NEGATIVE
Protein, UA: NEGATIVE
Spec Grav, UA: 1.01 (ref 1.010–1.025)
Urobilinogen, UA: 0.2 E.U./dL
pH, UA: 6.5 (ref 5.0–8.0)

## 2021-10-31 NOTE — Assessment & Plan Note (Signed)
She reports having had pelvic pain which has been more noticeable for the past 2 days. She does have history of lichen sclerosus for which she uses topical cream. She does follow with [facility] - next appt is later this week. She then developed some hematuria this morning - noted pink urine, one episode. Has had some discomfort with urinating. Unsure if this is related to lichen sclerosus. Denies any fever, chills, sweats, back pain, flank pain. In the office today, patient is in no acute distress,vital signs are stable, patient is afebrile. Urinalysis completed in the office today with evidence of blood on dipstick.  No obvious signs of infection with negative nitrite and negative leukocyte esterase. Given history and urine dipstick findings, will send urine for further analysis including microscopic analysis assessment for albuminuria Additionally, will check BMP today to assess current creatinine function, this has been normal in the past She does have upcoming appointment with her OB/GYN, recommend continuing with this Further evaluation of hematuria pending results of additional urine testing

## 2021-10-31 NOTE — Progress Notes (Signed)
    Procedures performed today:    None.  Independent interpretation of notes and tests performed by another provider:   None.  Brief History, Exam, Impression, and Recommendations:    BP 111/77   Pulse 60   Ht 5\' 4"  (1.626 m)   Wt 132 lb (59.9 kg)   SpO2 100%   BMI 22.66 kg/m   Hematuria She reports having had pelvic pain which has been more noticeable for the past 2 days. She does have history of lichen sclerosus for which she uses topical cream. She does follow with [facility] - next appt is later this week. She then developed some hematuria this morning - noted pink urine, one episode. Has had some discomfort with urinating. Unsure if this is related to lichen sclerosus. Denies any fever, chills, sweats, back pain, flank pain. In the office today, patient is in no acute distress,vital signs are stable, patient is afebrile. Urinalysis completed in the office today with evidence of blood on dipstick.  No obvious signs of infection with negative nitrite and negative leukocyte esterase. Given history and urine dipstick findings, will send urine for further analysis including microscopic analysis assessment for albuminuria Additionally, will check BMP today to assess current creatinine function, this has been normal in the past She does have upcoming appointment with her OB/GYN, recommend continuing with this Further evaluation of hematuria pending results of additional urine testing   ___________________________________________ Carla Corne de [place], MD, ABFM, CAQSM Primary Care and Sports Medicine [facility]

## 2021-10-31 NOTE — Patient Instructions (Signed)
  Medication Instructions:  Your physician recommends that you continue on your current medications as directed. Please refer to the Current Medication list given to you today. --If you need a refill on any your medications before your next appointment, please call your pharmacy first. If no refills are authorized on file call the office.-- Lab Work: Your physician has recommended that you have lab work today: No If you have labs (blood work) drawn today and your tests are completely normal, you will receive your results via MyChart message OR a phone call from our staff.  Please ensure you check your voicemail in the event that you authorized detailed messages to be left on a delegated number. If you have any lab test that is abnormal or we need to change your treatment, we will call you to review the results.  Referrals/Procedures/Imaging: No  Follow-Up: Your next appointment:   Your physician recommends that you schedule a follow-up appointment in: as needed with Dr. de [place].  You will receive a text message or e-mail with a link to a survey about your care and experience with us today! We would greatly appreciate your feedback!   Thanks for letting us be apart of your health journey!!  Primary Care and Sports Medicine   Dr. Raymond de Cuba   We encourage you to activate your patient portal called "MyChart".  Sign up information is provided on this After Visit Summary.  MyChart is used to connect with patients for Virtual Visits (Telemedicine).  Patients are able to view lab/test results, encounter notes, upcoming appointments, etc.  Non-urgent messages can be sent to your provider as well. To learn more about what you can do with MyChart, please visit --  [email or web address].    

## 2021-11-01 LAB — URINALYSIS, MICROSCOPIC ONLY
Bacteria, UA: NONE SEEN
Casts: NONE SEEN /lpf
Epithelial Cells (non renal): NONE SEEN /hpf (ref 0–10)
RBC, Urine: NONE SEEN /hpf (ref 0–2)
WBC, UA: NONE SEEN /hpf (ref 0–5)

## 2021-11-01 LAB — MICROALBUMIN / CREATININE URINE RATIO
Creatinine, Urine: 11.2 mg/dL
Microalb/Creat Ratio: 71 mg/g creat — ABNORMAL HIGH (ref 0–29)
Microalbumin, Urine: 7.9 ug/mL

## 2021-11-01 LAB — BASIC METABOLIC PANEL
BUN/Creatinine Ratio: 21 (ref 12–28)
BUN: 15 mg/dL (ref 8–27)
CO2: 22 mmol/L (ref 20–29)
Calcium: 9.8 mg/dL (ref 8.7–10.3)
Chloride: 102 mmol/L (ref 96–106)
Creatinine, Ser: 0.71 mg/dL (ref 0.57–1.00)
Glucose: 97 mg/dL (ref 70–99)
Potassium: 5.1 mmol/L (ref 3.5–5.2)
Sodium: 140 mmol/L (ref 134–144)
eGFR: 94 mL/min/{1.73_m2} (ref >59)

## 2021-11-02 ENCOUNTER — Ambulatory Visit (HOSPITAL_BASED_OUTPATIENT_CLINIC_OR_DEPARTMENT_OTHER): Payer: BC Managed Care – PPO | Admitting: Family Medicine

## 2021-11-03 DIAGNOSIS — L900 Lichen sclerosus et atrophicus: Secondary | ICD-10-CM | POA: Diagnosis not present

## 2021-12-20 ENCOUNTER — Ambulatory Visit
Admission: RE | Admit: 2021-12-20 | Discharge: 2021-12-20 | Disposition: A | Discharge status: Home / Self Care | Payer: BC Managed Care – PPO | Source: Ambulatory Visit | Attending: Obstetrics and Gynecology | Admitting: Obstetrics and Gynecology

## 2021-12-20 DIAGNOSIS — Z1231 Encounter for screening mammogram for malignant neoplasm of breast: Secondary | ICD-10-CM

## 2021-12-21 ENCOUNTER — Encounter (HOSPITAL_BASED_OUTPATIENT_CLINIC_OR_DEPARTMENT_OTHER): Payer: Self-pay

## 2022-01-03 ENCOUNTER — Encounter: Payer: Self-pay | Admitting: Internal Medicine

## 2022-01-18 ENCOUNTER — Encounter: Payer: Self-pay | Admitting: Internal Medicine

## 2022-01-18 ENCOUNTER — Ambulatory Visit (INDEPENDENT_AMBULATORY_CARE_PROVIDER_SITE_OTHER): Payer: BC Managed Care – PPO | Admitting: Internal Medicine

## 2022-01-18 VITALS — BP 104/70 | HR 68 | Ht 64.0 in | Wt 136.0 lb

## 2022-01-18 DIAGNOSIS — M816 Localized osteoporosis [Lequesne]: Secondary | ICD-10-CM | POA: Diagnosis not present

## 2022-01-18 NOTE — Progress Notes (Signed)
Name: Carla Aguilar  MRN/ DOB: [id number], May 20, 1955    Age/ Sex: 66 y.o., female    PCP: de [place], Buren Kos, MD   Reason for Endocrinology Evaluation: Hypercalcemia      Date of Initial Endocrinology Evaluation: 12/07/2020    HPI: Carla Aguilar is a 66 y.o. female with a past medical history of Insomnia , IBS and lichensclerosis. The patient presented for initial endocrinology clinic visit on 12/07/2020 for consultative assistance with her Hypercalcemia .   Carla Aguilar indicates that she was first diagnosed with hypercalcemia in 01/2020, at which time serum calcium was 11.5 mg/dL ( non corrected) with an inappropriately normal PTh .   No HCTZ use     She has osteoporosis, has had hx of rib fracture after sustaining a fall , declines anti-resorption therapy .  She denies  family history of osteoporosis, parathyroid disease,  Father with hx of thyroid disease.   She is S/P right inferior parathyroidectomy 10/17/2021 with Pth level decreasing from 75 to 21 at 15 min mark  ( Dr. Gwyndolyn Kaufman) Postop calcium 9.7 mg/dL    OSTEOPOROSIS HISTORY:  Pt was diagnosed with osteoporosis:05/2019 with a T-score of -2.6 at the left femoral neck   Menarche at age : does not recall  Menopausal at age : does not recall  Fracture Hx: rib fracture  Hx of HRT: no FH of osteoporosis or hip fracture: no Prior Hx of anti-estrogenic therapy :no  Prior Hx of anti-resorptive therapy : no    She researched Fosamax causes leg pains. She would like to hold of on treatment until 05/2021  for bone density     SUBJECTIVE:     Today (01/18/22):  Carla Aguilar is here for a follow up on hyperparathyroidism.  She is S/P right inferior parathyroidectomy 10/17/2021 Since her surgery she has felt great with resolution of constipation and polydipsia. She also attributes resolution of insomnia to the surgery and does not need to be on medication to help her sleep  Patient also attributes resolution  of thumb pains to the surgery and has been off NSAIDs Denies recent falls or bone fractures  She has heartburn or dysphagia   Vitamin D3 1000 iu daily  Calcium+ vitamin D 500/400   HISTORY:  Past Medical History:  Past Medical History:  Diagnosis Date   Anxiety    Arthritis    Past Surgical History: No past surgical history on file.  Social History:  reports that she has never smoked. She has never used smokeless tobacco. She reports current alcohol use of about 14.0 standard drinks of alcohol per week. She reports that she does not use drugs. Family History: family history includes Arrhythmia in her maternal uncle, mother, and sister; Arthritis in her maternal uncle, mother, paternal uncle, and sister; Cirrhosis in her father; Diabetes in her father; Heart disease in her maternal grandfather, maternal uncle, and mother; Hypertension in her mother; Kidney disease in her father and mother; Kidney failure in her father.   HOME MEDICATIONS: Allergies as of 01/18/2022       Reactions   Sulfa Antibiotics Itching, Rash   Sulfasalazine Itching, Rash        Medication List        Accurate as of January 18, 2022 10:29 AM. If you have any questions, ask your nurse or doctor.          STOP taking these medications    meloxicam 7.5 MG tablet Commonly known as:  MOBIC Stopped by: Scarlette Shorts, MD   zolpidem 5 MG tablet Commonly known as: AMBIEN Stopped by: Scarlette Shorts, MD       TAKE these medications    fluocinonide ointment 0.05 % Commonly known as: LIDEX Apply 1 application topically 2 (two) times daily.   Magnesium Glycinate 665 MG Caps Take 1 capsule by mouth daily.   MULTIPLE VITAMINS/WOMENS PO Take by mouth. Nature Made   Theanine 100 MG Caps Take 1 tablet by mouth daily at 6 (six) AM.   Vitamin D 50 MCG (2000 UT) Caps Take 1 tablet by mouth daily at 6 (six) AM.          REVIEW OF SYSTEMS: A comprehensive ROS was conducted with the  patient and is negative except as per HPI     OBJECTIVE:  VS: BP 104/70 (BP Location: Left Arm, Patient Position: Sitting, Cuff Size: Small)   Pulse 68   Ht 5\' 4"  (1.626 m)   Wt 136 lb (61.7 kg)   SpO2 98%   BMI 23.34 kg/m    Wt Readings from Last 3 Encounters:  01/18/22 136 lb (61.7 kg)  10/31/21 132 lb (59.9 kg)  09/15/21 135 lb (61.2 kg)     EXAM: General: Pt appears well and is in NAD  Neck: General: Supple without adenopathy. Thyroid: Thyroid size normal.  No goiter or nodules appreciated.   Lungs: Clear with good BS bilat with no rales, rhonchi, or wheezes  Heart: Auscultation: RRR.  Abdomen: Normoactive bowel sounds, soft, nontender, without masses or organomegaly palpable  Extremities:  BL LE: No pretibial edema normal ROM and strength.  Mental Status: Judgment, insight: Intact Orientation: Oriented to time, place, and person Mood and affect: No depression, anxiety, or agitation     DATA REVIEWED:  12/22/2021 BUN 21 Cr 0.67 GFR 97 Alb 4.4 Ca 9.2        DXA 09/01/2021 The BMD measured at Femur Neck Left is 0.655 g/cm2 with a T-score of -2.8. This patient is considered osteoporotic according to [facility]) criteria.   The quality of the exam is good. The lumbar spine was excluded due to being excluded on prior exam.   Site Region Measured Date Measured Age YA BMD Significant CHANGE T-score DualFemur Neck Left 09/01/2021 65.5 -2.8 0.655 g/cm2 DualFemur Neck Left 06/17/2019 63.3 -2.6 0.683 g/cm2   DualFemur Total Mean 09/01/2021 65.5 -1.8 0.778 g/cm2 * DualFemur Total Mean 06/17/2019 63.3 -1.3 0.841 g/cm2 *   Left Forearm Radius 33% 09/01/2021 65.5 -2.2 0.681 g/cm2 * Left Forearm Radius 33% 06/17/2019 63.3 -1.4 0.767 g/cm2     ASSESSMENT/PLAN/RECOMMENDATIONS:    1. Osteoporosis :  -DXA scan 08/2021 showed worsening of osteoporosis  -Due to GERD and recent dysphagia we will avoid oral bisphosphonate therapy, patient  would like zoledronic acid annually  -An order has been placed for zoledronic acid 5 mg IV every 12 months through the infusion suite  2. Primary Hyperparathyroidism :   -She is s/p parathyroidectomy 10/17/2021 with resolution of hypercalcemia and normalization of PTH -Repeat serum calcium 2 months postop continues to be normal -Patient may go back to annual calcium checkups, no need for PTH checkups unless calcium starts to rise again       F/U in1 yr   Signed electronically by: 10/19/2021, MD  [facility] Endocrinology  [facility] [address] [place], [place] Phone: [phone] FAX: [phone]   CC: [phone], Raymond J,  MD [address] [place] [address] Phone: [phone] Fax: [phone]   Return to Endocrinology clinic as below: Future Appointments  Date Time Provider Department Center  02/14/2022  9:50 AM de [place], Raymond J, MD [facility]

## 2022-01-24 ENCOUNTER — Telehealth: Payer: Self-pay | Admitting: Pharmacy Technician

## 2022-01-24 NOTE — Telephone Encounter (Signed)
Dr. Kelton Pillar, Juluis Rainier note:  Auth Submission: NO AUTH NEEDED Payer: [employer] Medication & CPT/J Code(s) submitted: Reclast (Zolendronic acid) [id number] Route of submission (phone, fax, portal):  Phone # Fax # Auth type: Buy/Bill Units/visits requested: X1 Reference number:  Approval from: 01/24/22 to 01/23/23   Patient will be scheduled as soon as possible

## 2022-01-31 ENCOUNTER — Ambulatory Visit (HOSPITAL_BASED_OUTPATIENT_CLINIC_OR_DEPARTMENT_OTHER): Payer: BC Managed Care – PPO | Admitting: Family Medicine

## 2022-02-14 ENCOUNTER — Encounter (HOSPITAL_BASED_OUTPATIENT_CLINIC_OR_DEPARTMENT_OTHER): Payer: Self-pay

## 2022-02-14 ENCOUNTER — Ambulatory Visit (HOSPITAL_BASED_OUTPATIENT_CLINIC_OR_DEPARTMENT_OTHER): Payer: BC Managed Care – PPO | Admitting: Family Medicine

## 2022-03-03 ENCOUNTER — Encounter (HOSPITAL_BASED_OUTPATIENT_CLINIC_OR_DEPARTMENT_OTHER): Payer: Self-pay

## 2022-04-03 DIAGNOSIS — Z01411 Encounter for gynecological examination (general) (routine) with abnormal findings: Secondary | ICD-10-CM | POA: Diagnosis not present

## 2022-04-03 DIAGNOSIS — Z6823 Body mass index (BMI) 23.0-23.9, adult: Secondary | ICD-10-CM | POA: Diagnosis not present

## 2022-04-03 DIAGNOSIS — L900 Lichen sclerosus et atrophicus: Secondary | ICD-10-CM | POA: Diagnosis not present

## 2022-04-05 ENCOUNTER — Encounter (HOSPITAL_BASED_OUTPATIENT_CLINIC_OR_DEPARTMENT_OTHER): Payer: Self-pay | Admitting: Family Medicine

## 2022-04-05 ENCOUNTER — Ambulatory Visit (HOSPITAL_BASED_OUTPATIENT_CLINIC_OR_DEPARTMENT_OTHER): Payer: Medicare Other | Admitting: Family Medicine

## 2022-04-05 VITALS — BP 117/71 | HR 67 | Ht 64.0 in | Wt 135.0 lb

## 2022-04-05 DIAGNOSIS — M810 Age-related osteoporosis without current pathological fracture: Secondary | ICD-10-CM

## 2022-04-05 DIAGNOSIS — E559 Vitamin D deficiency, unspecified: Secondary | ICD-10-CM | POA: Diagnosis not present

## 2022-04-05 DIAGNOSIS — E213 Hyperparathyroidism, unspecified: Secondary | ICD-10-CM | POA: Diagnosis not present

## 2022-04-05 DIAGNOSIS — E785 Hyperlipidemia, unspecified: Secondary | ICD-10-CM | POA: Diagnosis not present

## 2022-04-05 NOTE — Progress Notes (Signed)
    Procedures performed today:    None.  Independent interpretation of notes and tests performed by another provider:   None.  Brief History, Exam, Impression, and Recommendations:    BP 117/71 (BP Location: Right Arm, Patient Position: Sitting, Cuff Size: Normal)   Pulse 67   Ht  (1.626 m)   Wt 135 lb (61.2 kg)   SpO2 100%   BMI 23.17 kg/m   Hyperparathyroidism (HCC) Patient indicates that since parathyroidectomy, she has been doing extremely well.  She reports that a lot of the symptoms that she was experiencing have improved since the procedure.  Her calcium has returned to normal levels. It has been about 6 months since last calcium level was checked, we will proceed with recheck today to ensure stability  Osteoporosis Prior bone density testing in August 2023 indicated osteoporosis.  Given that she has not had treatment for underlying hyperparathyroidism, would be reasonable to reassess effect on bone density.  Order has been placed for patient to proceed with bone density testing  Vitamin D deficiency Given recent parathyroidectomy and prior calcium imbalance, we will need to reassess vitamin D levels to ensure this is optimized.  We will proceed with assessment of vitamin D level today  Return in about 6 months (around 10/06/2022) for hypercalcemia.   ___________________________________________ Carla Lolli de [place], MD, ABFM, CAQSM Primary Care and Sports Medicine [facility]

## 2022-04-06 LAB — COMPREHENSIVE METABOLIC PANEL
ALT: 20 IU/L (ref 0–32)
AST: 21 IU/L (ref 0–40)
Albumin/Globulin Ratio: 1.8 (ref 1.2–2.2)
Albumin: 4.6 g/dL (ref 3.9–4.9)
Alkaline Phosphatase: 74 IU/L (ref 44–121)
BUN/Creatinine Ratio: 23 (ref 12–28)
BUN: 17 mg/dL (ref 8–27)
Bilirubin Total: 0.7 mg/dL (ref 0.0–1.2)
CO2: 22 mmol/L (ref 20–29)
Calcium: 9.5 mg/dL (ref 8.7–10.3)
Chloride: 100 mmol/L (ref 96–106)
Creatinine, Ser: 0.73 mg/dL (ref 0.57–1.00)
Globulin, Total: 2.5 g/dL (ref 1.5–4.5)
Glucose: 106 mg/dL — ABNORMAL HIGH (ref 70–99)
Potassium: 4.6 mmol/L (ref 3.5–5.2)
Sodium: 139 mmol/L (ref 134–144)
Total Protein: 7.1 g/dL (ref 6.0–8.5)
eGFR: 91 mL/min/{1.73_m2} (ref >59)

## 2022-04-06 LAB — LIPID PANEL
Chol/HDL Ratio: 3.1 ratio (ref 0.0–4.4)
Cholesterol, Total: 267 mg/dL — ABNORMAL HIGH (ref 100–199)
HDL: 85 mg/dL (ref >39)
LDL Chol Calc (NIH): 164 mg/dL — ABNORMAL HIGH (ref 0–99)
Triglycerides: 106 mg/dL (ref 0–149)
VLDL Cholesterol Cal: 18 mg/dL (ref 5–40)

## 2022-04-06 LAB — VITAMIN D 25 HYDROXY (VIT D DEFICIENCY, FRACTURES): Vit D, 25-Hydroxy: 35.4 ng/mL (ref 30.0–100.0)

## 2022-04-09 ENCOUNTER — Encounter (HOSPITAL_BASED_OUTPATIENT_CLINIC_OR_DEPARTMENT_OTHER): Payer: Self-pay | Admitting: Family Medicine

## 2022-05-04 ENCOUNTER — Encounter: Payer: Self-pay | Admitting: Internal Medicine

## 2022-05-10 ENCOUNTER — Encounter: Payer: Self-pay | Admitting: Internal Medicine

## 2022-05-10 NOTE — Telephone Encounter (Signed)
F/u: new insurance Medicare A/B  Will need to verify insurance. Called patient no answer. Left v/m.

## 2022-05-16 NOTE — Assessment & Plan Note (Signed)
Patient indicates that since parathyroidectomy, she has been doing extremely well.  She reports that a lot of the symptoms that she was experiencing have improved since the procedure.  Her calcium has returned to normal levels. It has been about 6 months since last calcium level was checked, we will proceed with recheck today to ensure stability

## 2022-05-16 NOTE — Assessment & Plan Note (Signed)
Prior bone density testing in August 2023 indicated osteoporosis.  Given that she has not had treatment for underlying hyperparathyroidism, would be reasonable to reassess effect on bone density.  Order has been placed for patient to proceed with bone density testing

## 2022-05-16 NOTE — Assessment & Plan Note (Signed)
Given recent parathyroidectomy and prior calcium imbalance, we will need to reassess vitamin D levels to ensure this is optimized.  We will proceed with assessment of vitamin D level today

## 2022-06-01 IMAGING — MG MM DIGITAL SCREENING BILAT W/ TOMO AND CAD
8 series · 9 of 24 positions shown · non-contrast
Comparison: Previous exam(s).

CLINICAL DATA: Screening.

EXAM:
DIGITAL SCREENING BILATERAL MAMMOGRAM WITH TOMOSYNTHESIS AND CAD
TECHNIQUE: Bilateral screening digital craniocaudal and mediolateral oblique
mammograms were obtained. Bilateral screening digital breast
tomosynthesis was performed. The images were evaluated with
computer-aided detection.

[R CC synth-2D]
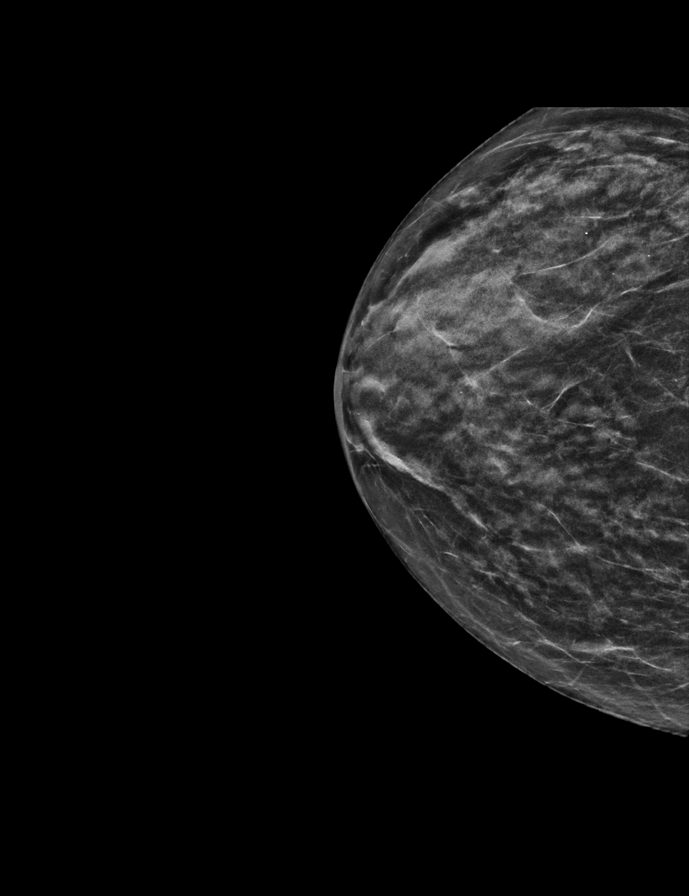

[R MLO synth-2D]
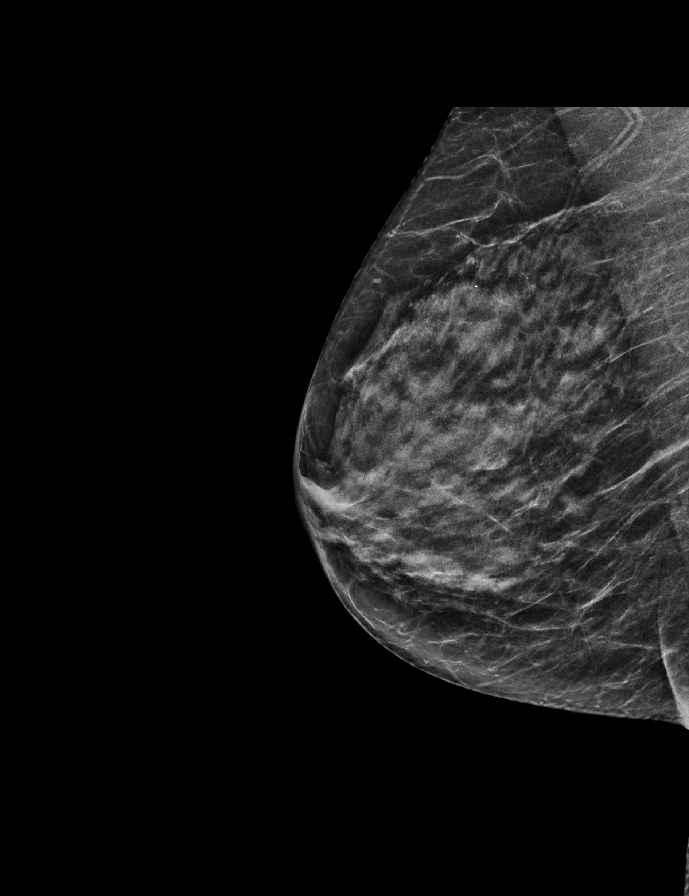

[L MLO synth-2D]
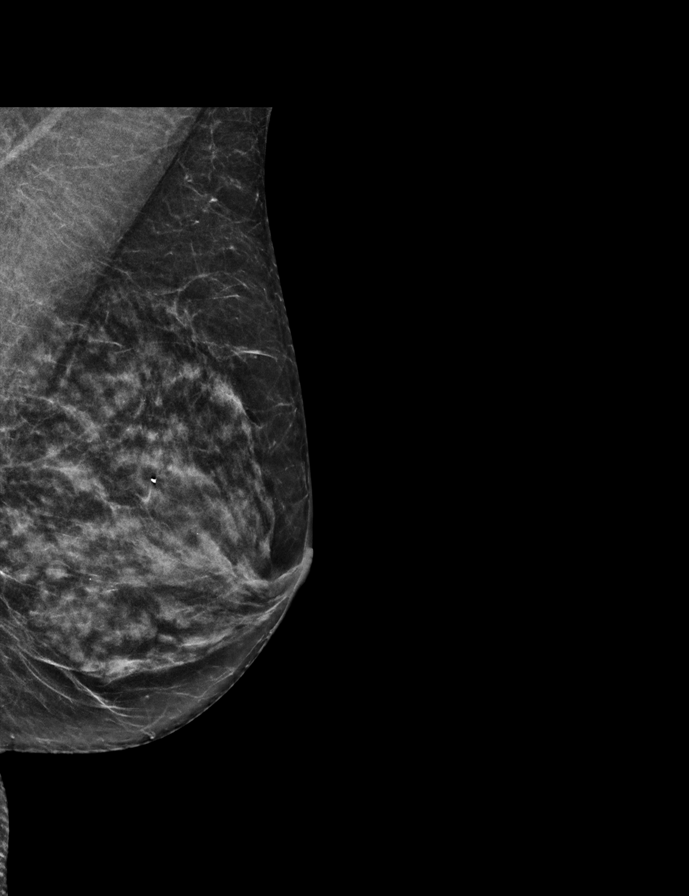

[L CC synth-2D]
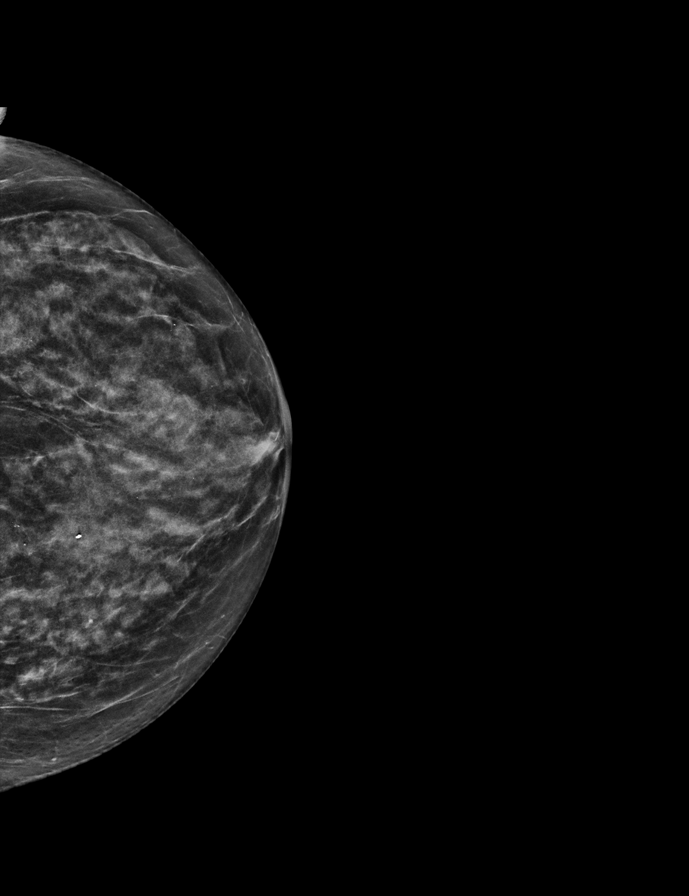

[L MLO tomo · 2 of 50 frames shown]
[frame 17/50]
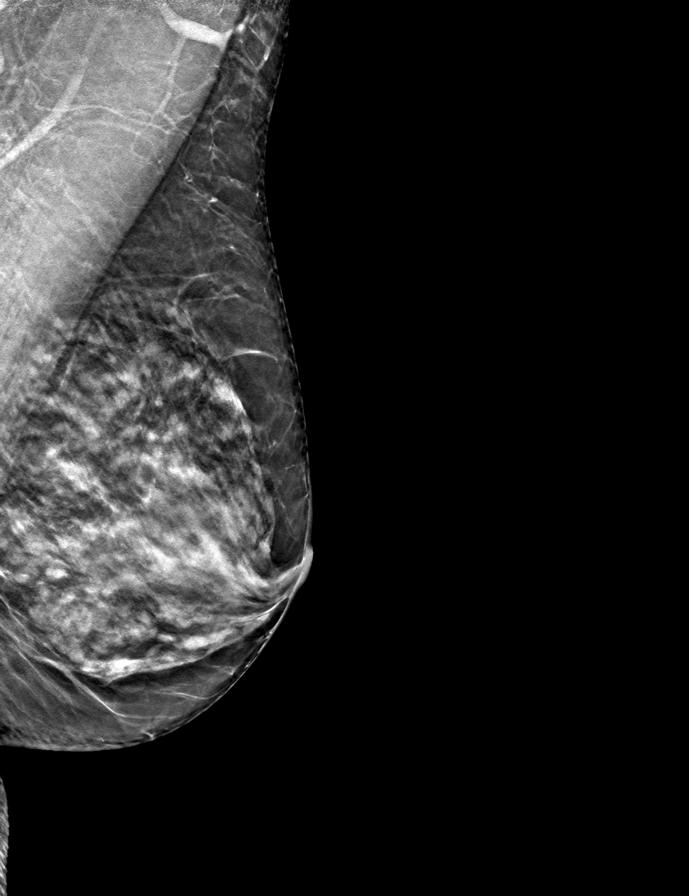
[frame 25/50]
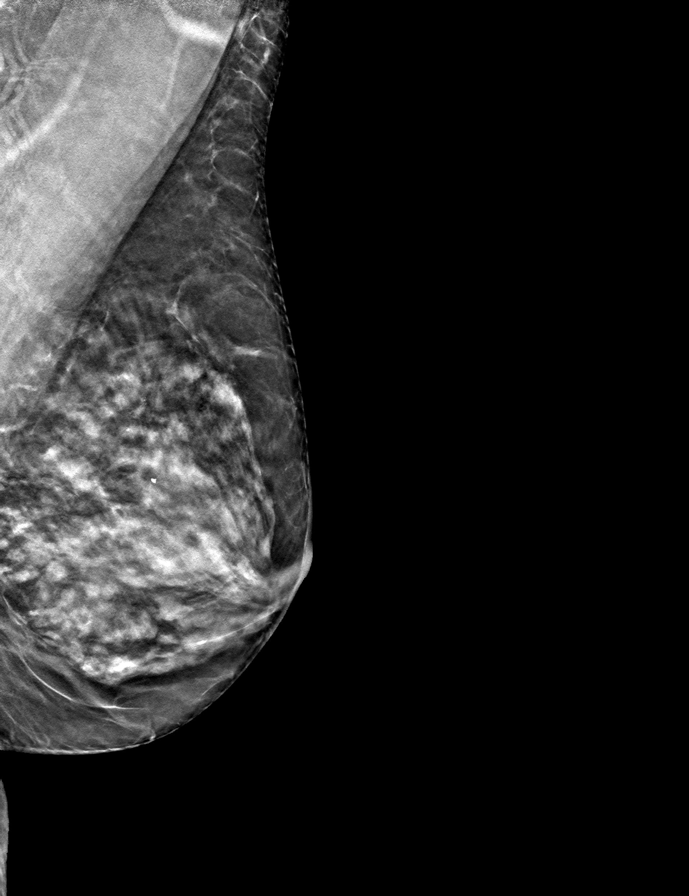

[L CC tomo · tomo slice 23/44.0]
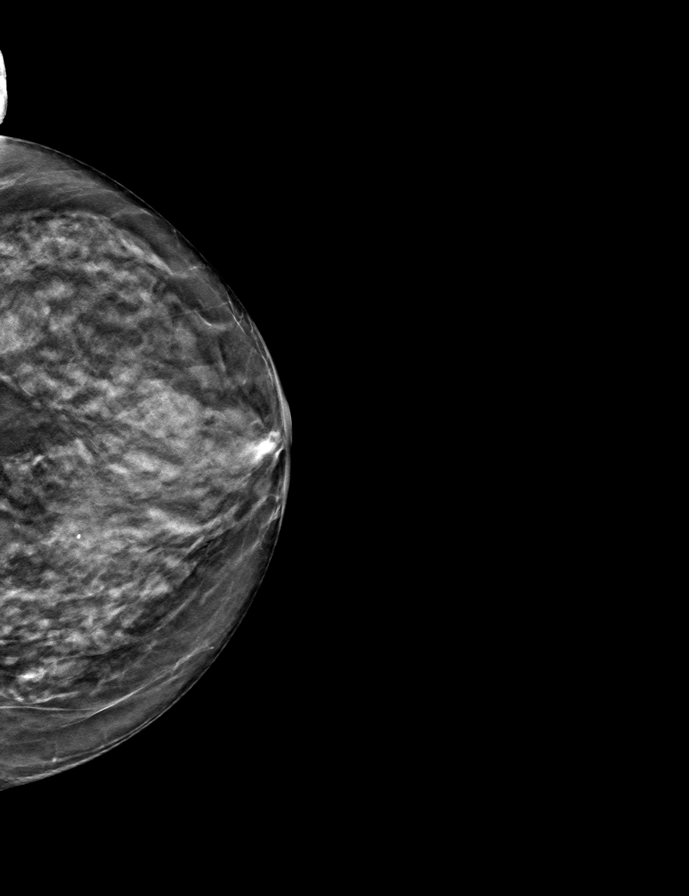

[R CC tomo · tomo slice 22/43.0]
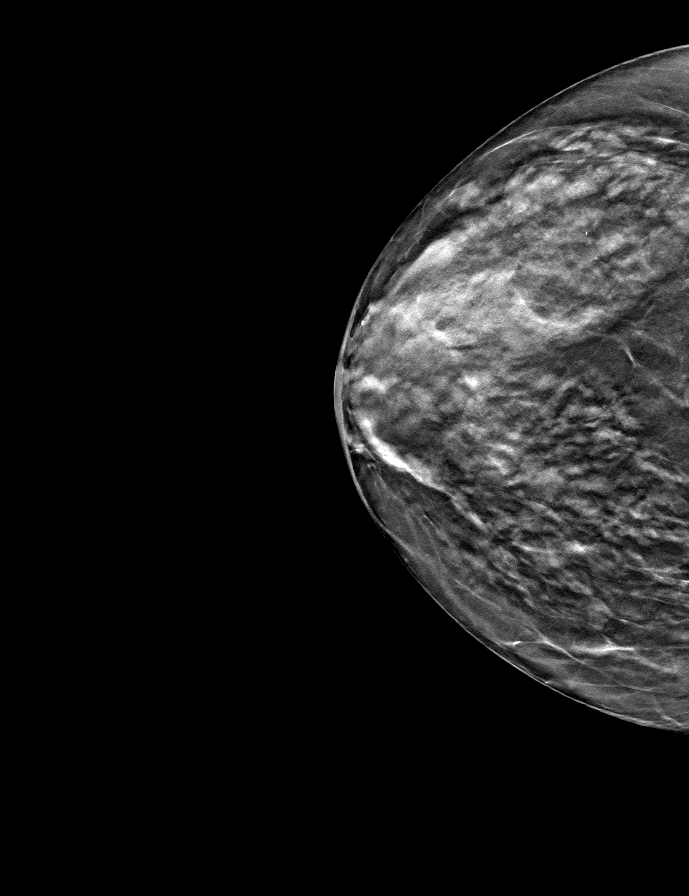

[R MLO tomo · tomo slice 25/48.0]
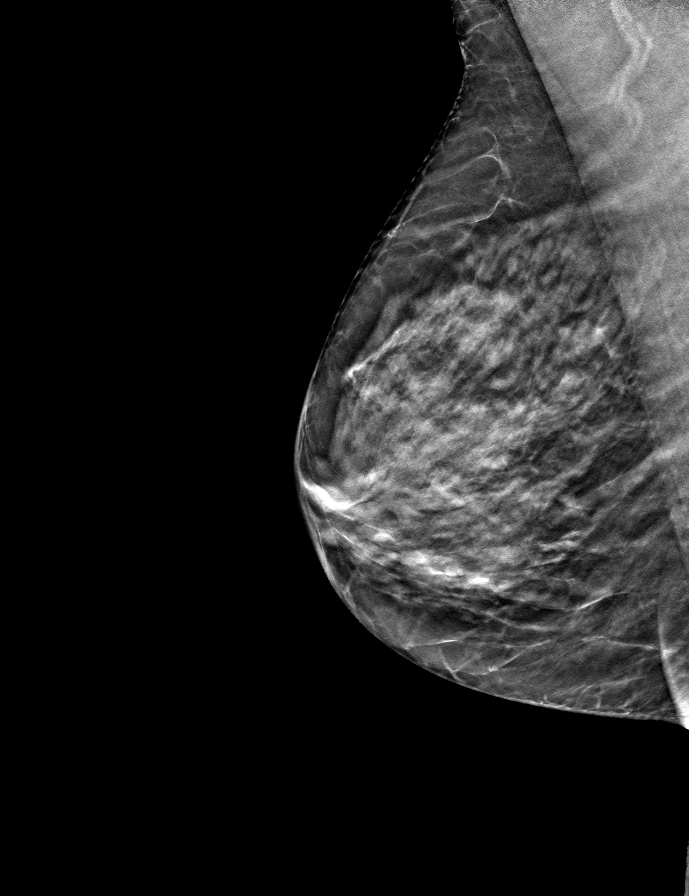

[9 of 24 positions shown; findings below may reference images not displayed]

ACR Breast Density Category d: The breast tissue is extremely dense,
which lowers the sensitivity of mammography.
FINDINGS: There are no findings suspicious for malignancy.
IMPRESSION: No mammographic evidence of malignancy. A result letter of this
screening mammogram will be mailed directly to the patient.

RECOMMENDATION:
Screening mammogram in one year. (Code:BM-N-AXN)

BI-RADS CATEGORY  1: Negative.

## 2022-08-28 ENCOUNTER — Encounter (HOSPITAL_BASED_OUTPATIENT_CLINIC_OR_DEPARTMENT_OTHER): Payer: Self-pay | Admitting: Family Medicine

## 2022-08-28 ENCOUNTER — Ambulatory Visit (INDEPENDENT_AMBULATORY_CARE_PROVIDER_SITE_OTHER): Payer: Medicare Other | Admitting: Family Medicine

## 2022-08-28 VITALS — BP 118/70 | HR 76 | Temp 97.6°F | Ht 64.0 in | Wt 134.0 lb

## 2022-08-28 DIAGNOSIS — Z0000 Encounter for general adult medical examination without abnormal findings: Secondary | ICD-10-CM | POA: Diagnosis not present

## 2022-08-28 DIAGNOSIS — E559 Vitamin D deficiency, unspecified: Secondary | ICD-10-CM

## 2022-08-28 NOTE — Progress Notes (Unsigned)
Subjective:    Carla Aguilar is a 67 y.o. female who presents for a Welcome to Medicare exam.    Review of Systems ***        Objective:    Today's Vitals   08/28/22 1031  BP: 118/70  Pulse: 76  Temp: 97.6 F (36.4 C)  SpO2: 100%  Weight: 134 lb (60.8 kg)  Height: 5\' 4"  (1.626 m)  Body mass index is 23 kg/m.  Medications Outpatient Encounter Medications as of 08/28/2022  Medication Sig   Calcium 250 MG CAPS    Cholecalciferol (VITAMIN D) 50 MCG (2000 UT) CAPS Take 1 tablet by mouth daily at 6 (six) AM.   fluocinonide ointment (LIDEX) 0.05 % Apply 1 application topically 2 (two) times daily.   Magnesium Glycinate 665 MG CAPS Take 1 capsule by mouth daily.   Multiple Vitamins-Minerals (MULTIPLE VITAMINS/WOMENS PO) Take by mouth. Nature Made   Theanine 100 MG CAPS Take 1 tablet by mouth daily at 6 (six) AM.   No facility-administered encounter medications on file as of 08/28/2022.     History: Past Medical History:  Diagnosis Date   Anxiety    Arthritis    No past surgical history on file.  Family History  Problem Relation Age of Onset   Heart disease Mother    Hypertension Mother    Arrhythmia Mother    Arthritis Mother    Kidney disease Mother    Cirrhosis Father    Kidney failure Father    Diabetes Father    Kidney disease Father    Arthritis Sister    Arrhythmia Sister    Arrhythmia Maternal Uncle    Arthritis Maternal Uncle    Heart disease Maternal Grandfather    Arthritis Paternal Uncle    Heart disease Maternal Uncle    Cancer Neg Hx    Social History   Occupational History   Not on file  Tobacco Use   Smoking status: Never   Smokeless tobacco: Never  Substance and Sexual Activity   Alcohol use: Yes    Alcohol/week: 14.0 standard drinks of alcohol    Types: 14 Glasses of wine per week   Drug use: Never   Sexual activity: Yes    Birth control/protection: None    Tobacco Counseling Counseling given: Not Answered   Immunizations  and Health Maintenance Immunization History  Administered Date(s) Administered   Influenza,inj,Quad PF,6+ Mos 10/05/2018   Influenza,inj,Quad PF,6-35 Mos 12/27/2015, 11/14/2016, 10/18/2017   Influenza-Unspecified 11/13/2021   Janssen (J&J) SARS-COV-2 Vaccination 04/06/2019   Pneumococcal Polysaccharide-23 02/25/2021   Tdap 11/26/2008   Health Maintenance Due  Topic Date Due   Zoster Vaccines- Shingrix (1 of 2) Never done   DTaP/Tdap/Td (2 - Td or Tdap) 11/27/2018   COVID-19 Vaccine (2 - 2023-24 season) 09/23/2021   Pneumonia Vaccine 49+ Years old (2 of 2 - PCV) 02/25/2022   INFLUENZA VACCINE  08/24/2022    Activities of Daily Living    08/28/2022   10:29 AM 08/25/2022   12:58 PM  In your present state of health, do you have any difficulty performing the following activities:  Hearing? 0 0  Vision? 0 0  Difficulty concentrating or making decisions? 0 0  Walking or climbing stairs? 0 0  Dressing or bathing? 0 0  Doing errands, shopping? 0 0  Preparing Food and eating ?  N  Using the Toilet?  N  In the past six months, have you accidently leaked urine?  N  Do  you have problems with loss of bowel control?  N  Managing your Medications?  N  Managing your Finances?  N  Housekeeping or managing your Housekeeping?  N    Physical Exam  ***(optional), or other factors deemed appropriate based on the beneficiary's medical and social history and current clinical standards.  Advanced Directives:      Assessment:    This is a routine wellness examination for this patient . ***  Vision/Hearing screen No results found.  Dietary issues and exercise activities discussed:      Goals   None   Depression Screen    08/28/2022   10:27 AM 04/05/2022    9:00 AM 10/31/2021   11:11 AM 09/15/2021    4:12 PM  PHQ 2/9 Scores  PHQ - 2 Score 0 0 0 0  PHQ- 9 Score 0  0 0  Exception Documentation Medical reason Medical reason Medical reason Medical reason     Fall Risk    08/28/2022    10:27 AM  Fall Risk   Falls in the past year? 0  Number falls in past yr: 0  Injury with Fall? 0  Risk for fall due to : No Fall Risks  Follow up Falls evaluation completed    Cognitive Function:        08/28/2022   10:25 AM  6CIT Screen  What Year? 0 points  What month? 0 points  What time? 0 points  Count back from 20 0 points  Months in reverse 0 points  Repeat phrase 0 points  Total Score 0 points    Patient Care Team: [place], Buren Kos, MD as PCP - General (Family Medicine) Laddie Aquas, [place] M, PT as Physical Therapist (Physical Therapy) Yolonda Kida, MD as Consulting Physician (Orthopedic Surgery)     Plan:   ***  I have personally reviewed and noted the following in the patient's chart:   Medical and social history Use of alcohol, tobacco or illicit drugs  Current medications and supplements Functional ability and status Nutritional status Physical activity Advanced directives List of other physicians Hospitalizations, surgeries, and ER visits in previous 12 months Vitals Screenings to include cognitive, depression, and falls Referrals and appointments  In addition, I have reviewed and discussed with patient certain preventive protocols, quality metrics, and best practice recommendations. A written personalized care plan for preventive services as well as general preventive health recommendations were provided to patient.     Karman Veney J [place], MD 08/28/2022           Annual Wellness Visit  Patient: Carla Aguilar, Female    DOB: 10/26/1955, 67 y.o.   MRN: [id number]  Subjective  Chief Complaint  Patient presents with   Medicare Wellness    Pt here for medicare wellness visit     Carla Aguilar is a 67 y.o. female who presents today for her Annual Wellness Visit. She reports consuming a low fat diet. Home exercise routine includes stretching and walking 1 hrs per days. She generally feels fairly well. She reports sleeping well.  She does not have additional problems to discuss today.  Dental: Current dental problems - new TMD  Past Medical History:  Diagnosis Date   Anxiety    Arthritis    No past surgical history on file. Family History  Problem Relation Age of Onset   Heart disease Mother    Hypertension Mother    Arrhythmia Mother    Arthritis Mother    Kidney  disease Mother    Cirrhosis Father    Kidney failure Father    Diabetes Father    Kidney disease Father    Arthritis Sister    Arrhythmia Sister    Arrhythmia Maternal Uncle    Arthritis Maternal Uncle    Heart disease Maternal Grandfather    Arthritis Paternal Uncle    Heart disease Maternal Uncle    Cancer Neg Hx       Medications: Outpatient Medications Prior to Visit  Medication Sig   Calcium 250 MG CAPS    Cholecalciferol (VITAMIN D) 50 MCG (2000 UT) CAPS Take 1 tablet by mouth daily at 6 (six) AM.   fluocinonide ointment (LIDEX) 0.05 % Apply 1 application topically 2 (two) times daily.   Magnesium Glycinate 665 MG CAPS Take 1 capsule by mouth daily.   Multiple Vitamins-Minerals (MULTIPLE VITAMINS/WOMENS PO) Take by mouth. Nature Made   Theanine 100 MG CAPS Take 1 tablet by mouth daily at 6 (six) AM.   No facility-administered medications prior to visit.    Allergies  Allergen Reactions   Sulfa Antibiotics Itching and Rash   Sulfasalazine Itching and Rash    Patient Care Team: [place], Buren Kos, MD as PCP - General (Family Medicine) McAmmond, [place] M, PT as Physical Therapist (Physical Therapy) Yolonda Kida, MD as Consulting Physician (Orthopedic Surgery)   Objective  BP 118/70 (BP Location: Left Arm, Patient Position: Sitting, Cuff Size: Normal)   Pulse 76   Temp 97.6 F (36.4 C)   Ht 5\' 4"  (1.626 m)   Wt 134 lb (60.8 kg)   SpO2 100%   BMI 23.00 kg/m    Most recent functional status assessment:    08/28/2022   10:29 AM  In your present state of health, do you have any difficulty performing the  following activities:  Hearing? 0  Vision? 0  Difficulty concentrating or making decisions? 0  Walking or climbing stairs? 0  Dressing or bathing? 0  Doing errands, shopping? 0   Most recent fall risk assessment:    08/28/2022   10:27 AM  Fall Risk   Falls in the past year? 0  Number falls in past yr: 0  Injury with Fall? 0  Risk for fall due to : No Fall Risks  Follow up Falls evaluation completed    Most recent depression screenings:    08/28/2022   10:27 AM 04/05/2022    9:00 AM  PHQ 2/9 Scores  PHQ - 2 Score 0 0  PHQ- 9 Score 0   Exception Documentation Medical reason Medical reason   Most recent cognitive screening:    08/28/2022   10:25 AM  6CIT Screen  What Year? 0 points  What month? 0 points  What time? 0 points  Count back from 20 0 points  Months in reverse 0 points  Repeat phrase 0 points  Total Score 0 points   Most recent Audit-C alcohol use screening    08/25/2022   12:58 PM  Alcohol Use Disorder Test (AUDIT)  1. How often do you have a drink containing alcohol? 4  2. How many drinks containing alcohol do you have on a typical day when you are drinking? 0  3. How often do you have six or more drinks on one occasion? 0  AUDIT-C Score 4   A score of 3 or more in women, and 4 or more in men indicates increased risk for alcohol abuse, EXCEPT if all of the points are  from question 1   Vision/Hearing Screen: No results found.  {Labs (Optional):23779}  No results found for any visits on 08/28/22.    Assessment & Plan   Annual wellness visit done today including the all of the following: Reviewed patient's Family Medical History Reviewed and updated list of patient's medical providers Assessment of cognitive impairment was done Assessed patient's functional ability Established a written schedule for health screening services Health Risk Assessent Completed and Reviewed  Exercise Activities and Dietary recommendations  Goals   None      Immunization History  Administered Date(s) Administered   Influenza,inj,Quad PF,6+ Mos 10/05/2018   Influenza,inj,Quad PF,6-35 Mos 12/27/2015, 11/14/2016, 10/18/2017   Influenza-Unspecified 11/13/2021   Janssen (J&J) SARS-COV-2 Vaccination 04/06/2019   Pneumococcal Polysaccharide-23 02/25/2021   Tdap 11/26/2008    Health Maintenance  Topic Date Due   Zoster Vaccines- Shingrix (1 of 2) Never done   DTaP/Tdap/Td (2 - Td or Tdap) 11/27/2018   COVID-19 Vaccine (2 - 2023-24 season) 09/23/2021   Pneumonia Vaccine 49+ Years old (2 of 2 - PCV) 02/25/2022   INFLUENZA VACCINE  08/24/2022   Medicare Annual Wellness (AWV)  08/28/2023   MAMMOGRAM  12/21/2023   Fecal DNA (Cologuard)  05/16/2024   DEXA SCAN  Completed   Hepatitis C Screening  Completed   HPV VACCINES  Aged Out   Discussed health benefits of physical activity, and encouraged her to engage in regular exercise appropriate for her age and condition.    Problem List Items Addressed This Visit       Other   Vitamin D deficiency   Relevant Orders   VITAMIN D 25 Hydroxy (Vit-D Deficiency, Fractures)   Hypercalcemia   Relevant Orders   Calcium   Encounter for Medicare annual wellness exam - Primary    Return in 2 months (on 10/28/2022) for osteoporosis, labs.    Arissa Fagin J [place], MD

## 2022-09-04 ENCOUNTER — Encounter (HOSPITAL_BASED_OUTPATIENT_CLINIC_OR_DEPARTMENT_OTHER): Payer: Self-pay | Admitting: Family Medicine

## 2022-09-05 ENCOUNTER — Other Ambulatory Visit (HOSPITAL_BASED_OUTPATIENT_CLINIC_OR_DEPARTMENT_OTHER): Payer: Self-pay | Admitting: Family Medicine

## 2022-09-05 DIAGNOSIS — K130 Diseases of lips: Secondary | ICD-10-CM

## 2022-09-17 IMAGING — US US THYROID
1 series · 13 of 25 positions shown · non-contrast
Comparison: None.

CLINICAL DATA: Primary hyperparathyroidism evaluate for nodule

EXAM:
THYROID ULTRASOUND
TECHNIQUE: Ultrasound examination of the thyroid gland and adjacent soft
tissues was performed.

[Series 1: us thyroid · 13 of 74 slices shown]
[im 1/74]
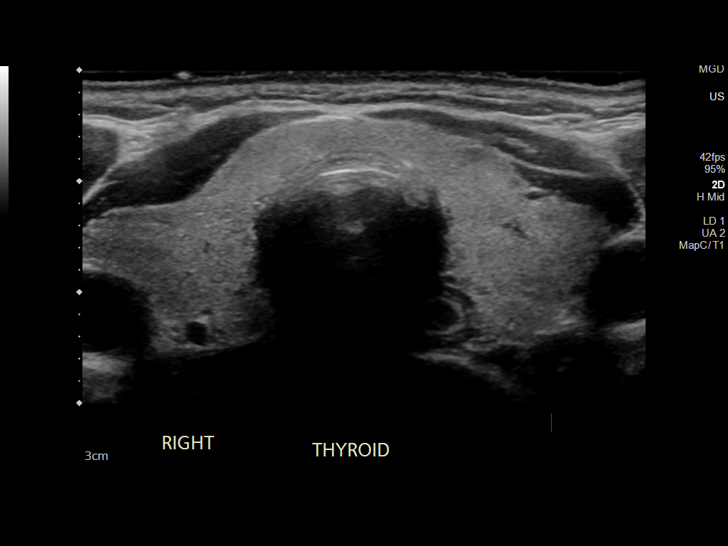
[im 7/74]
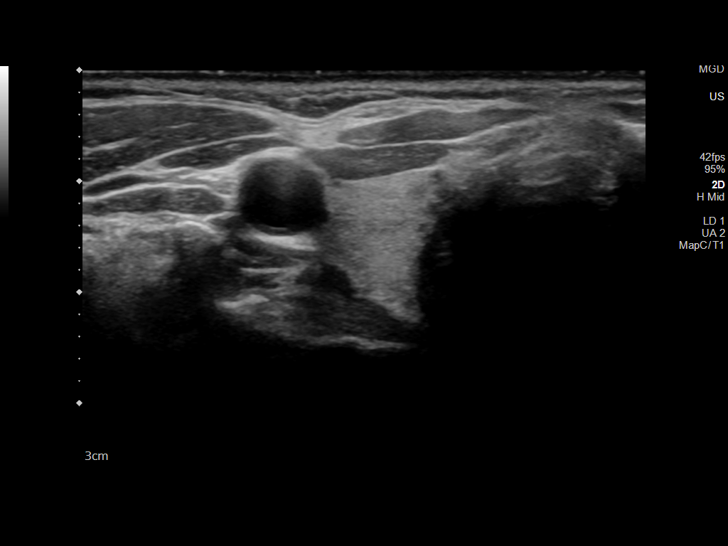
[im 13/74]
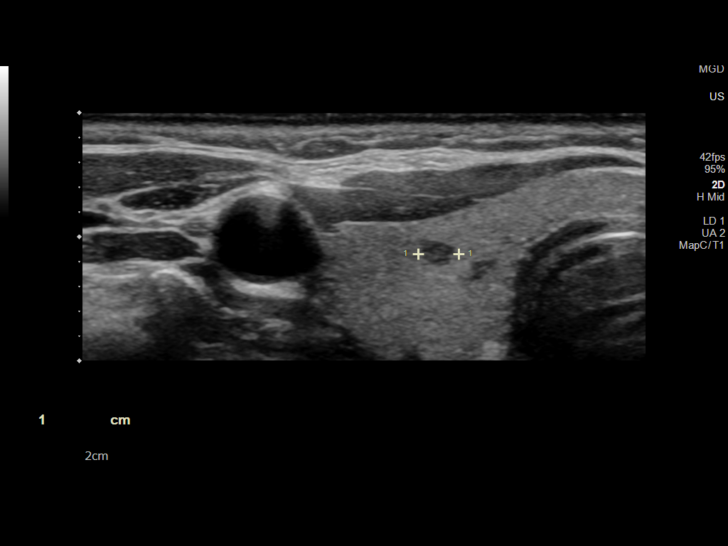
[im 19/74]
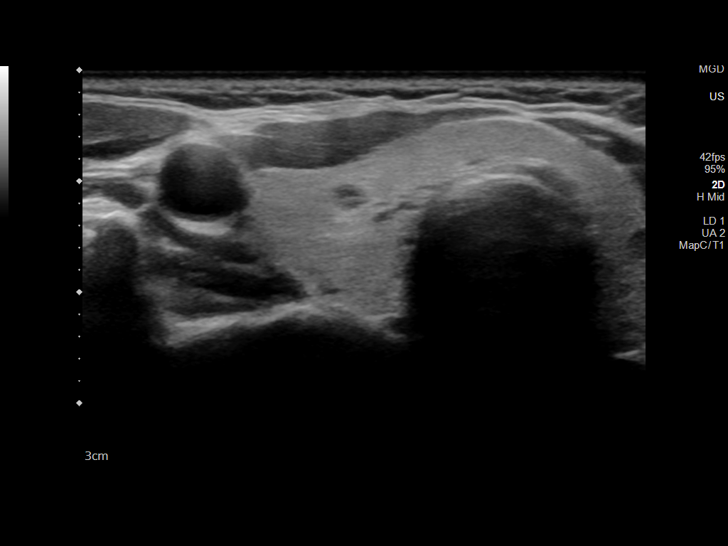
[im 25/74]
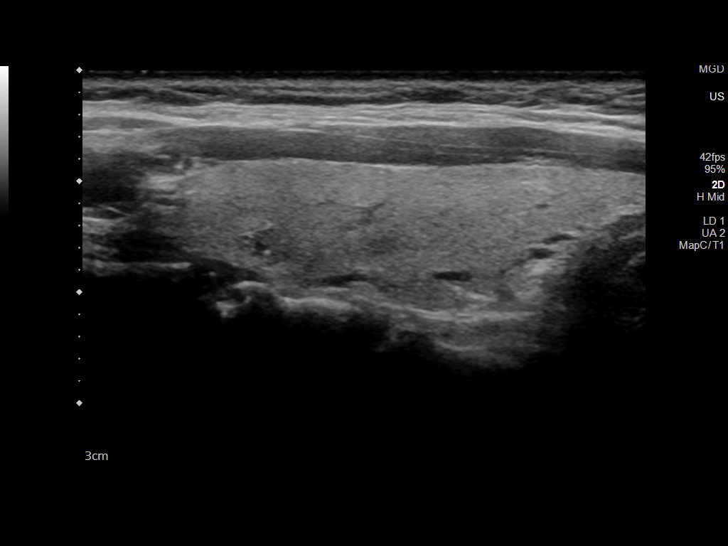
[im 31/74]
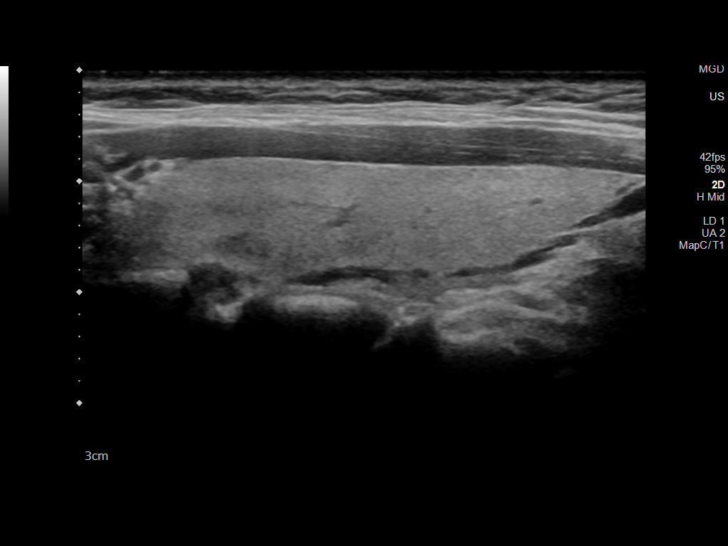
[im 37/74]
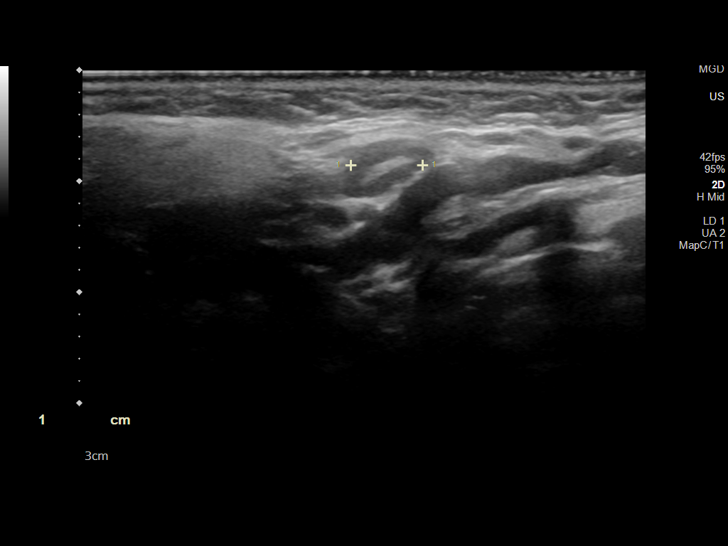
[im 43/74]
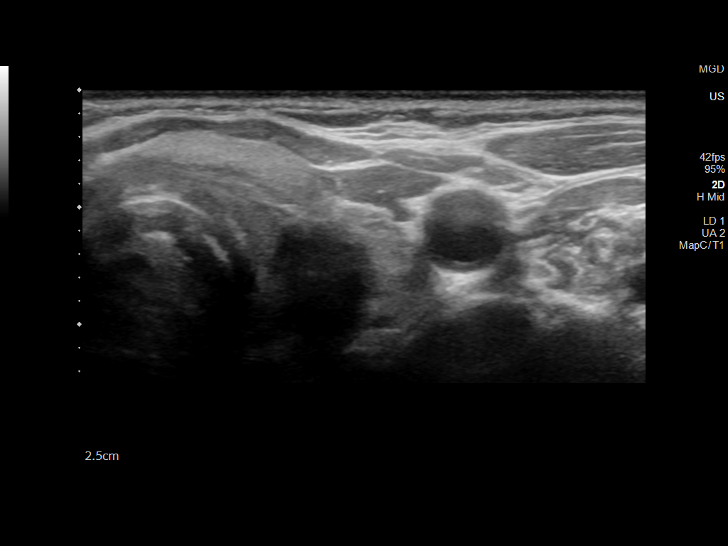
[im 49/74]
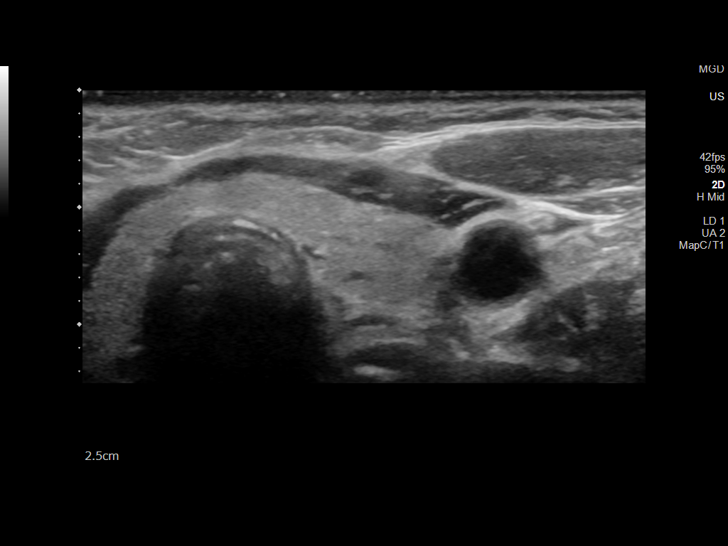
[im 55/74]
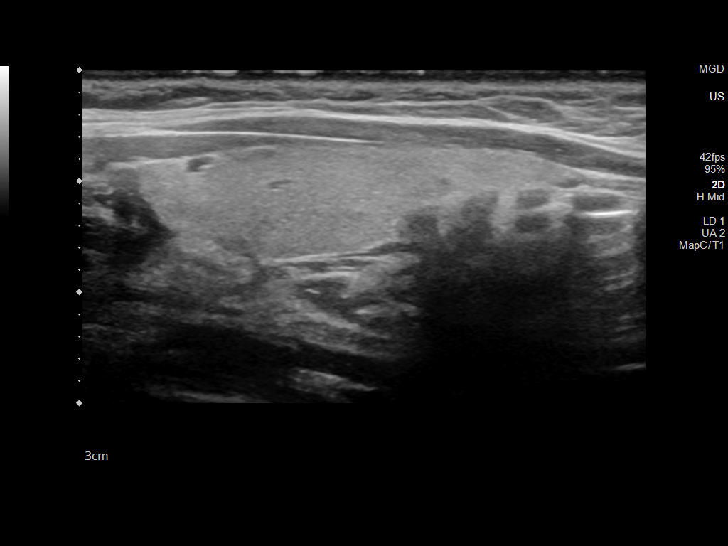
[im 61/74]
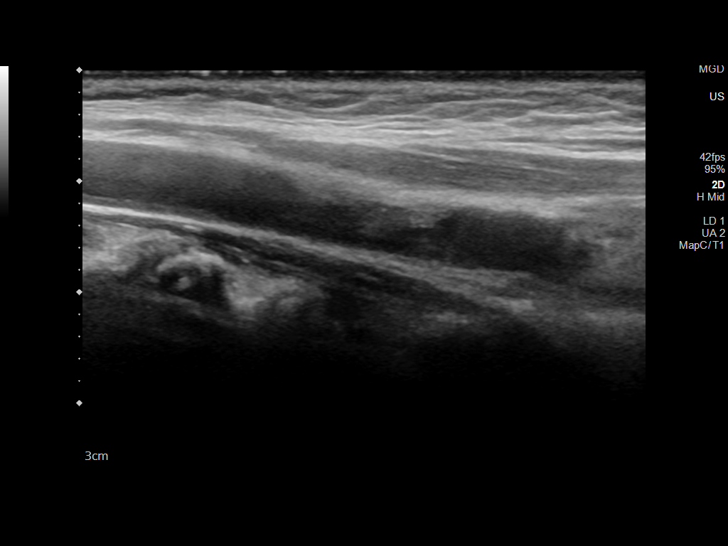
[im 67/74]
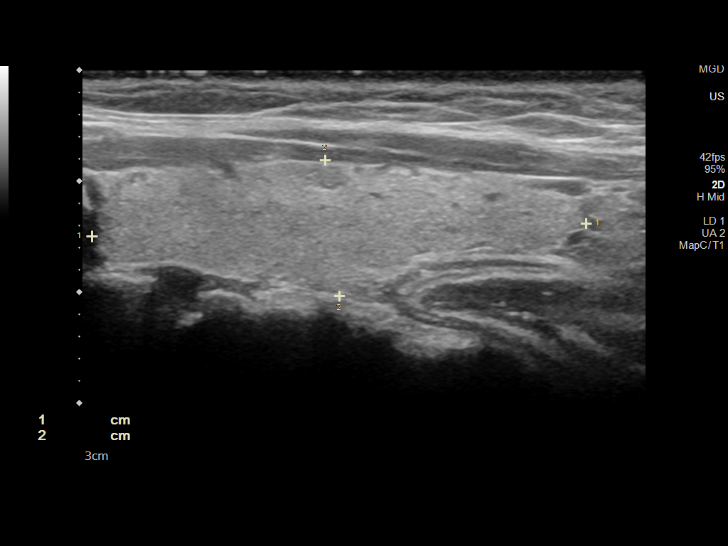
[im 74/74]
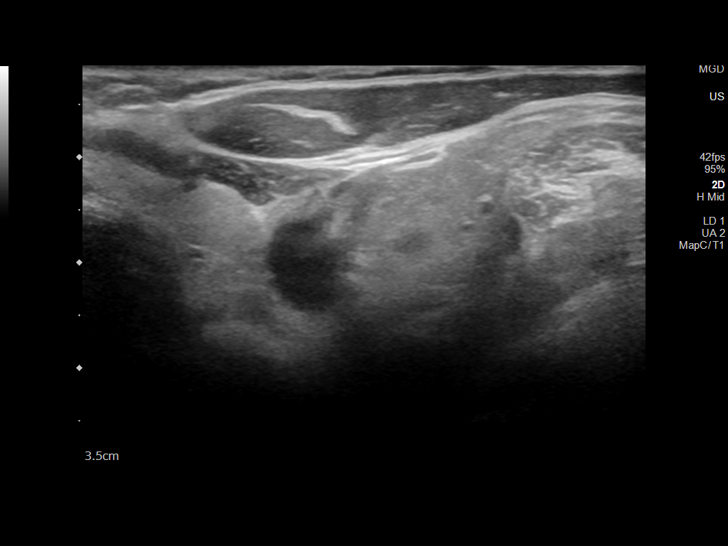

[13 of 25 positions shown; findings below may reference images not displayed]

FINDINGS: Parenchymal Echotexture: Normal

Isthmus: 3 mm

Right lobe: 4.3 x 1.2 x 1.3 cm

Left lobe: 4.4 x 1.2 x 1.3 cm

_________________________________________________________

Estimated total number of nodules >/= 1 cm: 0

Number of spongiform nodules >/=  2 cm not described below (TR1): 0

Number of mixed cystic and solid nodules >/= 1.5 cm not described
below (TR2): 0

_________________________________________________________

There are a few scattered hypoechoic and mixed cystic/solid nodules
noted bilaterally, all measuring 5 mm or less. These would not meet
criteria for any biopsy or follow-up and are not fully described by
TI rads criteria. No significant thyroid abnormality greater than 1
cm. No hypervascularity. Normal cervical lymph nodes noted. No bulky
adenopathy.

No enlarged parathyroid gland or adenoma appreciated by ultrasound
IMPRESSION: Scattered subcentimeter nodules noted. Otherwise no significant
finding by ultrasound.

The above is in keeping with the ACR TI-RADS recommendations - [HOSPITAL] 4603;[DATE].

## 2022-09-29 ENCOUNTER — Encounter: Payer: Self-pay | Admitting: Internal Medicine

## 2022-10-03 ENCOUNTER — Ambulatory Visit
Admission: RE | Admit: 2022-10-03 | Discharge: 2022-10-03 | Disposition: A | Discharge status: Home / Self Care | Payer: Medicare Other | Source: Ambulatory Visit | Attending: Family Medicine | Admitting: Family Medicine

## 2022-10-03 ENCOUNTER — Encounter: Payer: Self-pay | Admitting: Internal Medicine

## 2022-10-03 DIAGNOSIS — M810 Age-related osteoporosis without current pathological fracture: Secondary | ICD-10-CM

## 2022-10-03 DIAGNOSIS — E213 Hyperparathyroidism, unspecified: Secondary | ICD-10-CM

## 2022-10-03 DIAGNOSIS — E559 Vitamin D deficiency, unspecified: Secondary | ICD-10-CM

## 2022-10-05 ENCOUNTER — Encounter (HOSPITAL_BASED_OUTPATIENT_CLINIC_OR_DEPARTMENT_OTHER): Payer: Self-pay | Admitting: Family Medicine

## 2022-10-05 DIAGNOSIS — Z136 Encounter for screening for cardiovascular disorders: Secondary | ICD-10-CM

## 2022-10-05 DIAGNOSIS — E785 Hyperlipidemia, unspecified: Secondary | ICD-10-CM

## 2022-10-06 ENCOUNTER — Ambulatory Visit (HOSPITAL_BASED_OUTPATIENT_CLINIC_OR_DEPARTMENT_OTHER): Payer: BC Managed Care – PPO | Admitting: Family Medicine

## 2022-10-18 ENCOUNTER — Encounter: Payer: Self-pay | Admitting: Internal Medicine

## 2022-10-20 ENCOUNTER — Ambulatory Visit (HOSPITAL_BASED_OUTPATIENT_CLINIC_OR_DEPARTMENT_OTHER): Payer: BC Managed Care – PPO

## 2022-10-23 ENCOUNTER — Other Ambulatory Visit (HOSPITAL_BASED_OUTPATIENT_CLINIC_OR_DEPARTMENT_OTHER): Payer: Medicare Other

## 2022-10-24 LAB — VITAMIN D 25 HYDROXY (VIT D DEFICIENCY, FRACTURES): Vit D, 25-Hydroxy: 41.2 ng/mL (ref 30.0–100.0)

## 2022-10-24 LAB — CALCIUM: Calcium: 9.2 mg/dL (ref 8.7–10.3)

## 2022-10-26 ENCOUNTER — Encounter: Payer: Self-pay | Admitting: Internal Medicine

## 2022-10-30 ENCOUNTER — Ambulatory Visit (INDEPENDENT_AMBULATORY_CARE_PROVIDER_SITE_OTHER): Payer: Medicare Other | Admitting: Family Medicine

## 2022-10-30 ENCOUNTER — Encounter (HOSPITAL_BASED_OUTPATIENT_CLINIC_OR_DEPARTMENT_OTHER): Payer: Self-pay | Admitting: Family Medicine

## 2022-10-30 VITALS — BP 102/75 | HR 71 | Ht 63.0 in | Wt 134.9 lb

## 2022-10-30 DIAGNOSIS — E785 Hyperlipidemia, unspecified: Secondary | ICD-10-CM | POA: Diagnosis not present

## 2022-10-30 DIAGNOSIS — E559 Vitamin D deficiency, unspecified: Secondary | ICD-10-CM

## 2022-10-30 DIAGNOSIS — M810 Age-related osteoporosis without current pathological fracture: Secondary | ICD-10-CM | POA: Diagnosis not present

## 2022-10-30 HISTORY — DX: Hyperlipidemia, unspecified: E78.5

## 2022-10-30 NOTE — Assessment & Plan Note (Signed)
Patient had recent recheck of bone density testing.  This indicated fairly stable results compared to prior bone density test about 1 year before.  Since that time, she has had treatment for hyperparathyroidism.  She did meet with endocrinology, however did not start recommended zoledronic acid.  We again reviewed treatment considerations and recommendations.  Patient was hesitant regarding osteoporosis medication due to what she has read online and potential adverse reactions. She is due to follow-up with endocrinology and about 3 months.  For now, she will continue with conservative measures, calcium/vitamin D, regular exercise.

## 2022-10-30 NOTE — Assessment & Plan Note (Signed)
Recent check of vitamin D level was normal which is reassuring.  Can continue with intermittent monitoring.

## 2022-10-30 NOTE — Assessment & Plan Note (Signed)
Noted in the past have elevated total cholesterol as well as LDL.  She is requesting to have CAC scoring completed to further evaluate for possible underlying heart disease.  This is arranged for later this month.  Did review we will have further information pertaining to possible cholesterol deposits within the arteries around the heart and thus recommendations pertaining to statin therapy.

## 2022-10-30 NOTE — Progress Notes (Signed)
    Procedures performed today:    None.  Independent interpretation of notes and tests performed by another provider:   None.  Brief History, Exam, Impression, and Recommendations:    BP 102/75 (BP Location: Right Arm, Patient Position: Sitting, Cuff Size: Normal)   Pulse 71   Ht 5\' 3"  (1.6 m)   Wt 134 lb 14.4 oz (61.2 kg)   SpO2 98%   BMI 23.90 kg/m   Vitamin D deficiency Assessment & Plan: Recent check of vitamin D level was normal which is reassuring.  Can continue with intermittent monitoring.   Osteoporosis without current pathological fracture, unspecified osteoporosis type Assessment & Plan: Patient had recent recheck of bone density testing.  This indicated fairly stable results compared to prior bone density test about 1 year before.  Since that time, she has had treatment for hyperparathyroidism.  She did meet with endocrinology, however did not start recommended zoledronic acid.  We again reviewed treatment considerations and recommendations.  Patient was hesitant regarding osteoporosis medication due to what she has read online and potential adverse reactions. She is due to follow-up with endocrinology and about 3 months.  For now, she will continue with conservative measures, calcium/vitamin D, regular exercise.   Hyperlipidemia, unspecified hyperlipidemia type Assessment & Plan: Noted in the past have elevated total cholesterol as well as LDL.  She is requesting to have CAC scoring completed to further evaluate for possible underlying heart disease.  This is arranged for later this month.  Did review we will have further information pertaining to possible cholesterol deposits within the arteries around the heart and thus recommendations pertaining to statin therapy.   Return in about 4 months (around 03/02/2023) for hyperlipidemia, osteoporosis.   ___________________________________________ Nissa Stannard de [place], MD, ABFM, CAQSM Primary Care and Sports Medicine [facility]

## 2022-11-10 ENCOUNTER — Ambulatory Visit (HOSPITAL_BASED_OUTPATIENT_CLINIC_OR_DEPARTMENT_OTHER)
Admission: RE | Admit: 2022-11-10 | Discharge: 2022-11-10 | Disposition: A | Discharge status: Home / Self Care | Payer: Self-pay | Source: Ambulatory Visit | Attending: Family Medicine | Admitting: Family Medicine

## 2022-11-10 ENCOUNTER — Other Ambulatory Visit: Payer: Self-pay | Admitting: Obstetrics and Gynecology

## 2022-11-10 ENCOUNTER — Ambulatory Visit (INDEPENDENT_AMBULATORY_CARE_PROVIDER_SITE_OTHER): Payer: Medicare Other | Admitting: *Deleted

## 2022-11-10 DIAGNOSIS — Z23 Encounter for immunization: Secondary | ICD-10-CM | POA: Diagnosis not present

## 2022-11-10 DIAGNOSIS — Z136 Encounter for screening for cardiovascular disorders: Secondary | ICD-10-CM | POA: Insufficient documentation

## 2022-11-10 DIAGNOSIS — Z1231 Encounter for screening mammogram for malignant neoplasm of breast: Secondary | ICD-10-CM

## 2022-11-10 DIAGNOSIS — E785 Hyperlipidemia, unspecified: Secondary | ICD-10-CM | POA: Insufficient documentation

## 2022-12-07 ENCOUNTER — Encounter: Payer: Self-pay | Admitting: Internal Medicine

## 2022-12-25 ENCOUNTER — Ambulatory Visit: Payer: Medicare Other

## 2022-12-26 ENCOUNTER — Ambulatory Visit
Admission: RE | Admit: 2022-12-26 | Discharge: 2022-12-26 | Disposition: A | Discharge status: Home / Self Care | Payer: Medicare Other | Source: Ambulatory Visit | Attending: Obstetrics and Gynecology

## 2022-12-26 DIAGNOSIS — Z1231 Encounter for screening mammogram for malignant neoplasm of breast: Secondary | ICD-10-CM

## 2022-12-28 ENCOUNTER — Other Ambulatory Visit: Payer: Self-pay | Admitting: Obstetrics and Gynecology

## 2022-12-28 DIAGNOSIS — R928 Other abnormal and inconclusive findings on diagnostic imaging of breast: Secondary | ICD-10-CM

## 2023-01-01 ENCOUNTER — Encounter (HOSPITAL_BASED_OUTPATIENT_CLINIC_OR_DEPARTMENT_OTHER): Payer: Self-pay | Admitting: Family Medicine

## 2023-01-01 ENCOUNTER — Ambulatory Visit (HOSPITAL_BASED_OUTPATIENT_CLINIC_OR_DEPARTMENT_OTHER): Payer: Medicare Other | Admitting: Family Medicine

## 2023-01-01 VITALS — BP 107/80 | HR 75 | Ht 63.0 in | Wt 135.3 lb

## 2023-01-01 DIAGNOSIS — R21 Rash and other nonspecific skin eruption: Secondary | ICD-10-CM | POA: Diagnosis not present

## 2023-01-01 MED ORDER — TRIAMCINOLONE ACETONIDE 0.1 % EX CREA: 1.0000 | TOPICAL_CREAM | Freq: Two times a day (BID) | CUTANEOUS | 0 refills | Status: AC

## 2023-01-01 NOTE — Progress Notes (Signed)
    Procedures performed today:    None.  Independent interpretation of notes and tests performed by another provider:   None.  Brief History, Exam, Impression, and Recommendations:    BP 107/80 (BP Location: Right Arm, Patient Position: Sitting, Cuff Size: Normal)   Pulse 75   Ht 5\' 3"  (1.6 m)   Wt 135 lb 4.8 oz (61.4 kg)   SpO2 100%   BMI 23.97 kg/m   Skin rash Assessment & Plan: Patient noted skin rash near lateral aspect of scapula over right side of back.  Area has been itchy, dry.  She has been putting some lotion on it, she is not certain what type of lotion.  Denies any prior similar issues in the past.  Has not had any bleeding or drainage.  Her main concern is related to recently dog sitting for someone and wondering if this may be related to fleas or bedbugs or some other bite.  She has not had a systemic symptom such as fever, chills, sweats. On exam, patient is in no acute distress.  Over lateral edge of distal scapula on right side, there is a patch of dry skin with mild erythema, no drainage or bleeding.  No tenderness to palpation over this area.  No warmth. No concerning findings on exam today.  Does not appear consistent with any sort of bedbug or insect bite.  Rash appears eczematous in nature.  Given appearance and history, we will proceed with topical steroid cream, instructed on proper use.  Can continue with topical moisturizer as well, instructed on utilizing hypoallergenic, fragrance free lotion as fragrances can be alcohol-based and lead to drying of skin at times.  Would expect to see improvement over the next 1 to 4 weeks of use.  If not proving as expected, recommend reaching back out to office   Other orders -     Triamcinolone Acetonide; Apply 1 Application topically 2 (two) times daily.  Dispense: 30 g; Refill: 0  Patient had recent mammogram completed which was arranged through her gynecologist.  Mammogram did show area of distortion within right breast.   Recommendation was for further evaluation which she has scheduled this upcoming week and for diagnostic mammogram and ultrasound.  Advised patient on continuing with this in order to obtain further information and determination if additional testing such as biopsy would be required  Return if symptoms worsen or fail to improve.   ___________________________________________ Isahia Hollerbach de [place], MD, ABFM, [facility] Primary Care and Sports Medicine [facility]

## 2023-01-01 NOTE — Assessment & Plan Note (Signed)
Patient noted skin rash near lateral aspect of scapula over right side of back.  Area has been itchy, dry.  She has been putting some lotion on it, she is not certain what type of lotion.  Denies any prior similar issues in the past.  Has not had any bleeding or drainage.  Her main concern is related to recently dog sitting for someone and wondering if this may be related to fleas or bedbugs or some other bite.  She has not had a systemic symptom such as fever, chills, sweats. On exam, patient is in no acute distress.  Over lateral edge of distal scapula on right side, there is a patch of dry skin with mild erythema, no drainage or bleeding.  No tenderness to palpation over this area.  No warmth. No concerning findings on exam today.  Does not appear consistent with any sort of bedbug or insect bite.  Rash appears eczematous in nature.  Given appearance and history, we will proceed with topical steroid cream, instructed on proper use.  Can continue with topical moisturizer as well, instructed on utilizing hypoallergenic, fragrance free lotion as fragrances can be alcohol-based and lead to drying of skin at times.  Would expect to see improvement over the next 1 to 4 weeks of use.  If not proving as expected, recommend reaching back out to office

## 2023-01-06 ENCOUNTER — Ambulatory Visit: Payer: Medicare Other

## 2023-01-06 ENCOUNTER — Ambulatory Visit
Admission: RE | Admit: 2023-01-06 | Discharge: 2023-01-06 | Disposition: A | Discharge status: Home / Self Care | Payer: Medicare Other | Source: Ambulatory Visit | Attending: Obstetrics and Gynecology | Admitting: Obstetrics and Gynecology

## 2023-01-06 DIAGNOSIS — R928 Other abnormal and inconclusive findings on diagnostic imaging of breast: Secondary | ICD-10-CM

## 2023-01-30 ENCOUNTER — Ambulatory Visit (INDEPENDENT_AMBULATORY_CARE_PROVIDER_SITE_OTHER): Payer: Medicare Other | Admitting: Internal Medicine

## 2023-01-30 ENCOUNTER — Encounter: Payer: Self-pay | Admitting: Internal Medicine

## 2023-01-30 VITALS — BP 118/72 | HR 64 | Ht 63.0 in | Wt 137.4 lb

## 2023-01-30 DIAGNOSIS — M816 Localized osteoporosis [Lequesne]: Secondary | ICD-10-CM

## 2023-01-30 NOTE — Patient Instructions (Signed)
 Increase Calcium between 1000-1200 mg daily  Continue Vitamin D 2000 iu daily

## 2023-01-30 NOTE — Progress Notes (Signed)
 Name: Carla Aguilar  MRN/ DOB: [id number], 01-07-1956    Age/ Sex: 68 y.o., female    PCP: de [place], Quintin PARAS, MD   Reason for Endocrinology Evaluation: Hypercalcemia      Date of Initial Endocrinology Evaluation: 12/07/2020    HPI: Carla Aguilar is a 68 y.o. female with a past medical history of Insomnia , IBS and lichensclerosis. The patient presented for initial endocrinology clinic visit on 12/07/2020 for consultative assistance with her Hypercalcemia .   Carla Aguilar indicates that she was first diagnosed with hypercalcemia in 01/2020, at which time serum calcium was 11.5 mg/dL ( non corrected) with an inappropriately normal PTh .   No HCTZ use     She has osteoporosis, has had hx of rib fracture after sustaining a fall , declines anti-resorption therapy .  She denies  family history of osteoporosis, parathyroid disease,  Father with hx of thyroid  disease.   She is S/P right inferior parathyroidectomy 10/17/2021 with Pth level decreasing from 75 to 21 at 15 min mark  ( Dr. Jannis Reese) Postop calcium 9.7 mg/dL    OSTEOPOROSIS HISTORY:  Pt was diagnosed with osteoporosis:05/2019 with a T-score of -2.6 at the left femoral neck   Menarche at age : does not recall  Menopausal at age : does not recall  Fracture Hx: rib fracture  Hx of HRT: no FH of osteoporosis or hip fracture: no Prior Hx of anti-estrogenic therapy :no  Prior Hx of anti-resorptive therapy : no    She researched Fosamax causes leg pains. She would like to hold of on treatment until 05/2021  for bone density    An order for zoledronic acid was entered into the system 12/2021 but by her visit 01/2023 this has not been done   SUBJECTIVE:     Today (01/30/23):  Carla Aguilar is here for a follow up on Osteoporosis    She is S/P right inferior parathyroidectomy 10/17/2021 She has not received zoledronic acid Denies recent falls  She is limiting ETOH  She is motivated to start weight bearing  exercise  Has worsening GERD symptoms  Denies constipation or diarrhea  No recent fractures or falls   Vitamin D3 2000 iu daily  Calcium+ vitamin D  500/400   HISTORY:  Past Medical History:  Past Medical History:  Diagnosis Date   Anxiety    Arthritis    Hyperlipidemia 10/30/2022   Past Surgical History: No past surgical history on file.  Social History:  reports that she has never smoked. She has never used smokeless tobacco. She reports current alcohol use of about 14.0 standard drinks of alcohol per week. She reports that she does not use drugs. Family History: family history includes Arrhythmia in her maternal uncle, mother, and sister; Arthritis in her maternal uncle, mother, paternal uncle, and sister; Cirrhosis in her father; Diabetes in her father; Heart disease in her maternal grandfather, maternal uncle, and mother; Hypertension in her mother; Kidney disease in her father and mother; Kidney failure in her father.   HOME MEDICATIONS: Allergies as of 01/30/2023       Reactions   Sulfa Antibiotics Itching, Rash   Sulfasalazine Itching, Rash        Medication List        Accurate as of January 30, 2023 10:42 AM. If you have any questions, ask your nurse or doctor.          Calcium 250 MG Caps   fluocinonide ointment 0.05 %  Commonly known as: LIDEX Apply 1 application topically 2 (two) times daily.   Magnesium Glycinate 665 MG Caps Take 1 capsule by mouth daily.   MULTIPLE VITAMINS/WOMENS PO Take by mouth. Nature Made   Theanine 100 MG Caps Take 1 tablet by mouth daily at 6 (six) AM.   triamcinolone  cream 0.1 % Commonly known as: KENALOG  Apply 1 Application topically 2 (two) times daily.   Vitamin D  50 MCG (2000 UT) Caps Take 1 tablet by mouth daily at 6 (six) AM.          REVIEW OF SYSTEMS: A comprehensive ROS was conducted with the patient and is negative except as per HPI     OBJECTIVE:  VS: BP 118/72 (BP Location: Left Arm, Patient  Position: Sitting, Cuff Size: Small)   Pulse 64   Ht 5' 3 (1.6 m)   Wt 137 lb 6.4 oz (62.3 kg)   SpO2 98%   BMI 24.34 kg/m    Wt Readings from Last 3 Encounters:  01/30/23 137 lb 6.4 oz (62.3 kg)  01/01/23 135 lb 4.8 oz (61.4 kg)  10/30/22 134 lb 14.4 oz (61.2 kg)     EXAM: General: Pt appears well and is in NAD  Neck: General: Supple without adenopathy. Thyroid : Thyroid  size normal.  No goiter or nodules appreciated.   Lungs: Clear with good BS bilat   Heart: Auscultation: RRR.  Abdomen: Soft, nontender  Extremities:  BL LE: No pretibial edema   Mental Status: Judgment, insight: Intact Orientation: Oriented to time, place, and person Mood and affect: No depression, anxiety, or agitation     DATA REVIEWED:  Latest Reference Range & Units 04/05/22 09:47  Sodium 134 - 144 mmol/L 139  Potassium 3.5 - 5.2 mmol/L 4.6  Chloride 96 - 106 mmol/L 100  CO2 20 - 29 mmol/L 22  Glucose 70 - 99 mg/dL 893 (H)  BUN 8 - 27 mg/dL 17  Creatinine 9.42 - 8.99 mg/dL 9.26  Calcium 8.7 - 89.6 mg/dL 9.5  BUN/Creatinine Ratio 12 - 28  23  eGFR >59 mL/min/1.73 91  Alkaline Phosphatase 44 - 121 IU/L 74  Albumin 3.9 - 4.9 g/dL 4.6  Albumin/Globulin Ratio 1.2 - 2.2  1.8  AST 0 - 40 IU/L 21  ALT 0 - 32 IU/L 20  Total Protein 6.0 - 8.5 g/dL 7.1  Total Bilirubin 0.0 - 1.2 mg/dL 0.7    Latest Reference Range & Units 10/23/22 09:15  Vitamin D , 25-Hydroxy 30.0 - 100.0 ng/mL 41.2   DXA 10/03/2022  Narrative & Impression   IMPRESSION: Referring Physician:  QUINTIN PARAS [place] Your patient completed a bone mineral density test using GE Lunar  ASSESSMENT: The BMD measured at Femur Neck Right is 0.650 g/cm2 with a T-score of -2.8. This patient is considered osteoporotic according to World Health Organization [facility]) criteria.   The quality of the exam is good. The lumbar spine was excluded due to degenerative changes as noted on previous exam. Site Region Measured Date Measured Age YA BMD  Significant CHANGE T-score DualFemur Neck Right 10/03/2022 66.6 -2.8 0.650 g/cm2 DualFemur Neck Right 09/01/2021 65.5 -2.7 0.660 g/cm2 *   DualFemur Total Mean 10/03/2022 66.6 -1.7 0.793 g/cm2 DualFemur Total Mean 09/01/2021 65.5 -1.8 0.778 g/cm2 *   Left Forearm Radius 33% 10/03/2022 66.6 -2.3 0.672 g/cm2 Left Forearm Radius 33% 09/01/2021 65.5 -2.2 0.681 g/cm2 *    DXA 09/01/2021 The BMD measured at Femur Neck Left is 0.655 g/cm2 with a T-score of -2.8. This  patient is considered osteoporotic according to World Health Organization [facility]) criteria.   The quality of the exam is good. The lumbar spine was excluded due to being excluded on prior exam.   Site Region Measured Date Measured Age YA BMD Significant CHANGE T-score DualFemur Neck Left 09/01/2021 65.5 -2.8 0.655 g/cm2 DualFemur Neck Left 06/17/2019 63.3 -2.6 0.683 g/cm2   DualFemur Total Mean 09/01/2021 65.5 -1.8 0.778 g/cm2 * DualFemur Total Mean 06/17/2019 63.3 -1.3 0.841 g/cm2 *   Left Forearm Radius 33% 09/01/2021 65.5 -2.2 0.681 g/cm2 * Left Forearm Radius 33% 06/17/2019 63.3 -1.4 0.767 g/cm2     ASSESSMENT/PLAN/RECOMMENDATIONS:    1. Osteoporosis :  -DXA scan 08/2021 showed worsening of osteoporosis repeat DXA scan shows stability 2024 -She did not receive Reclast last year, she would like to focus on lifestyle changes rather than taking medications at this time -She has history of GERD and we have opted to avoid oral bisphosphonate therapy -Emphasized the importance of increasing calcium to 1000-1200 mg daily -Continue vitamin D  2000 daily -Emphasized the importance of weightbearing exercises   F/U with PCP   Signed electronically by: Stefano Redgie Butts, MD  [facility]  [facility] [address] [place] [place], [place] [address] Phone: [phone] FAX: [phone]   CC: de [place], Raymond J, MD [address] [place] [address] Phone: [phone] Fax:  [phone]   Return to Endocrinology clinic as below: Future Appointments  Date Time Provider Department Center  03/02/2023 10:10 AM de [place], Raymond J, MD [facility]  03/27/2023 10:30 AM Alm Delon SAILOR, DO [facility] None

## 2023-03-02 ENCOUNTER — Ambulatory Visit (INDEPENDENT_AMBULATORY_CARE_PROVIDER_SITE_OTHER): Payer: Medicare Other | Admitting: Family Medicine

## 2023-03-02 VITALS — BP 103/70 | HR 71 | Ht 63.0 in | Wt 137.4 lb

## 2023-03-02 DIAGNOSIS — M810 Age-related osteoporosis without current pathological fracture: Secondary | ICD-10-CM | POA: Diagnosis not present

## 2023-03-02 DIAGNOSIS — R5383 Other fatigue: Secondary | ICD-10-CM | POA: Insufficient documentation

## 2023-03-02 DIAGNOSIS — Z0000 Encounter for general adult medical examination without abnormal findings: Secondary | ICD-10-CM

## 2023-03-02 NOTE — Patient Instructions (Signed)
  Medication Instructions:  Your physician recommends that you continue on your current medications as directed. Please refer to the Current Medication list given to you today. --If you need a refill on any your medications before your next appointment, please call your pharmacy first. If no refills are authorized on file call the office.-- Lab Work: Your physician has recommended that you have lab work today: fasting when able If you have labs (blood work) drawn today and your tests are completely normal, you will receive your results via MyChart message OR a phone call from our staff.  Please ensure you check your voicemail in the event that you authorized detailed messages to be left on a delegated number. If you have any lab test that is abnormal or we need to change your treatment, we will call you to review the results.   Follow-Up: Your next appointment:   Your physician recommends that you schedule a follow-up appointment in: 2 months physical  with Dr. de [place]  You will receive a text message or e-mail with a link to a survey about your care and experience with us  today! We would greatly appreciate your feedback!   Thanks for letting us  be apart of your health journey!!  Primary Care and Sports Medicine   Dr. Quintin sheerer [place]   We encourage you to activate your patient portal called MyChart.  Sign up information is provided on this After Visit Summary.  MyChart is used to connect with patients for Virtual Visits (Telemedicine).  Patients are able to view lab/test results, encounter notes, upcoming appointments, etc.  Non-urgent messages can be sent to your provider as well. To learn more about what you can do with MyChart, please visit --  [email or web address].

## 2023-03-02 NOTE — Assessment & Plan Note (Signed)
 Had recent visit with endocrinology.  Patient is currently managing with lifestyle modifications with preference for avoiding any pharmacotherapy.  She has been focusing on dietary supplementation with calcium and vitamin D .  Has some questions about dosing.  She has also been working on regular exercise, weightbearing exercises. We reviewed dosage recommendations for calcium and vitamin D .  Discussed exercises to focus on.

## 2023-03-02 NOTE — Progress Notes (Signed)
    Procedures performed today:    None.  Independent interpretation of notes and tests performed by another provider:   None.  Brief History, Exam, Impression, and Recommendations:    BP 103/70 (BP Location: Right Arm, Patient Position: Sitting, Cuff Size: Normal)   Pulse 71   Ht 5' 3 (1.6 m)   Wt 137 lb 6.4 oz (62.3 kg)   SpO2 98%   BMI 24.34 kg/m   Osteoporosis without current pathological fracture, unspecified osteoporosis type Assessment & Plan: Had recent visit with endocrinology.  Patient is currently managing with lifestyle modifications with preference for avoiding any pharmacotherapy.  She has been focusing on dietary supplementation with calcium and vitamin D .  Has some questions about dosing.  She has also been working on regular exercise, weightbearing exercises. We reviewed dosage recommendations for calcium and vitamin D .  Discussed exercises to focus on.   Wellness examination -     CBC with Differential/Platelet; Future -     Comprehensive metabolic panel; Future -     Lipid panel; Future -     TSH Rfx on Abnormal to Free T4; Future  Fatigue, unspecified type Assessment & Plan: Has been having some fatigue, she reports sleeping about 8-8/2 hours at night with occasional daytime naps, but limits these to about 15 minutes in order to not affect her nighttime sleep.  Feels that maybe over the past few months or so she has had slight increase in fatigue.  Thinks it may just be more so attributed to her age. Discussed considerations, we can proceed with baseline labs in conjunction with plan for physical, she will return to have these completed at her convenience.  Orders: -     CBC with Differential/Platelet; Future  Return in about 3 months (around 05/30/2023) for CPE.   ___________________________________________ Merryn Thaker de [place], MD, ABFM, [facility] Primary Care and Sports Medicine [facility]

## 2023-03-02 NOTE — Assessment & Plan Note (Signed)
 Has been having some fatigue, she reports sleeping about 8-8/2 hours at night with occasional daytime naps, but limits these to about 15 minutes in order to not affect her nighttime sleep.  Feels that maybe over the past few months or so she has had slight increase in fatigue.  Thinks it may just be more so attributed to her age. Discussed considerations, we can proceed with baseline labs in conjunction with plan for physical, she will return to have these completed at her convenience.

## 2023-03-27 ENCOUNTER — Ambulatory Visit (INDEPENDENT_AMBULATORY_CARE_PROVIDER_SITE_OTHER): Payer: Medicare Other | Admitting: Dermatology

## 2023-03-27 ENCOUNTER — Encounter: Payer: Self-pay | Admitting: Dermatology

## 2023-03-27 VITALS — BP 106/67

## 2023-03-27 DIAGNOSIS — D239 Other benign neoplasm of skin, unspecified: Secondary | ICD-10-CM | POA: Diagnosis not present

## 2023-03-27 DIAGNOSIS — W908XXA Exposure to other nonionizing radiation, initial encounter: Secondary | ICD-10-CM

## 2023-03-27 DIAGNOSIS — D1801 Hemangioma of skin and subcutaneous tissue: Secondary | ICD-10-CM | POA: Diagnosis not present

## 2023-03-27 DIAGNOSIS — Z1283 Encounter for screening for malignant neoplasm of skin: Secondary | ICD-10-CM

## 2023-03-27 DIAGNOSIS — R238 Other skin changes: Secondary | ICD-10-CM

## 2023-03-27 DIAGNOSIS — I878 Other specified disorders of veins: Secondary | ICD-10-CM

## 2023-03-27 DIAGNOSIS — L814 Other melanin hyperpigmentation: Secondary | ICD-10-CM | POA: Diagnosis not present

## 2023-03-27 DIAGNOSIS — D229 Melanocytic nevi, unspecified: Secondary | ICD-10-CM

## 2023-03-27 DIAGNOSIS — L578 Other skin changes due to chronic exposure to nonionizing radiation: Secondary | ICD-10-CM

## 2023-03-27 DIAGNOSIS — L821 Other seborrheic keratosis: Secondary | ICD-10-CM

## 2023-03-27 NOTE — Progress Notes (Signed)
   New Patient Visit   Subjective  Carla Aguilar is a 68 y.o. female who presents for the following: New Pt - Spot Check - TBSE  Patient states she has lip lesion located at the lip that she would like to have examined. Patient reports the areas have been there for 1 year. She reports the areas are not bothersome.Patient rates irritation 0  out of 10. She states that the areas have not spread. Patient reports she has previously been treated for these areas. Patient denied Hx of bx. Patient denied family history of skin cancer(s).   The following portions of the chart were reviewed this encounter and updated as appropriate: medications, allergies, medical history  Review of Systems:  No other skin or systemic complaints except as noted in HPI or Assessment and Plan.  Objective  Well appearing patient in no apparent distress; mood and affect are within normal limits.   A focused examination was performed of the following areas: lip   Relevant exam findings are noted in the Assessment and Plan.     Assessment & Plan   LENTIGINES, SEBORRHEIC KERATOSES, HEMANGIOMAS - Benign normal skin lesions - Benign-appearing - Call for any changes  BENIGN MELANOCYTIC NEVI - Tan-brown and/or pink-flesh-colored symmetric macules and papules - Benign appearing on exam today - Observation - Call clinic for new or changing moles - Recommend daily use of broad spectrum spf 30+ sunscreen to sun-exposed areas.   MILD ACTINIC DAMAGE - Chronic condition, secondary to cumulative UV/sun exposure - diffuse scaly erythematous macules with underlying dyspigmentation - Recommend daily broad spectrum sunscreen SPF 30+ to sun-exposed areas, reapply every 2 hours as needed.  - Staying in the shade or wearing long sleeves, sun glasses (UVA+UVB protection) and wide brim hats (4-inch brim around the entire circumference of the hat) are also recommended for sun protection.  - Call for new or changing  lesions.  Venous Lake  - Assessment: Longstanding venous lake on the lip, previously evaluated by a physician. Superficial blood vessel, not dangerous but may bleed if injured. Causing cosmetic concern for the patient. - Plan:     Option for treatment with Hyfrecator offered    Procedure involves numbing the area and cauterizing the lesion    Post-procedure expectations: formation of a black scab that will fall off in 1-2 weeks    Informed consent: small risk of white scar formation (rare)    Insurance coverage confirmed under benign destruction    Patient to consider treatment option    Apply Aquaphor to treated area if procedure is performed   DERMATOFIBROMA Exam: Firm pink/brown papulenodule with dimple sign. Treatment Plan: A dermatofibroma is a benign growth possibly related to trauma, such as an insect bite, cut from shaving, or inflamed acne-type bump.  Treatment options to remove include shave or excision with resulting scar and risk of recurrence.  Since benign-appearing and not bothersome, will observe for now.   SKIN CANCER SCREENING PERFORMED TODAY     Return in about 2 years (around 03/26/2025) for TBSE.    Documentation: I have reviewed the above documentation for accuracy and completeness, and I agree with the above.  I, Shirron Marcha Solders, CMA, am acting as scribe for [employer], DO.   Langston Reusing, DO

## 2023-03-27 NOTE — Patient Instructions (Signed)

## 2023-04-10 ENCOUNTER — Other Ambulatory Visit (HOSPITAL_BASED_OUTPATIENT_CLINIC_OR_DEPARTMENT_OTHER): Payer: Self-pay | Admitting: *Deleted

## 2023-04-10 DIAGNOSIS — R5383 Other fatigue: Secondary | ICD-10-CM

## 2023-04-10 DIAGNOSIS — Z0000 Encounter for general adult medical examination without abnormal findings: Secondary | ICD-10-CM

## 2023-04-10 LAB — COMPREHENSIVE METABOLIC PANEL
ALT: 16 IU/L (ref 0–32)
AST: 19 IU/L (ref 0–40)
Albumin: 4.4 g/dL (ref 3.9–4.9)
Alkaline Phosphatase: 70 IU/L (ref 44–121)
BUN/Creatinine Ratio: 24 (ref 12–28)
BUN: 18 mg/dL (ref 8–27)
Bilirubin Total: 0.6 mg/dL (ref 0.0–1.2)
CO2: 22 mmol/L (ref 20–29)
Calcium: 9.4 mg/dL (ref 8.7–10.3)
Chloride: 101 mmol/L (ref 96–106)
Creatinine, Ser: 0.74 mg/dL (ref 0.57–1.00)
Globulin, Total: 2.5 g/dL (ref 1.5–4.5)
Glucose: 94 mg/dL (ref 70–99)
Potassium: 4.4 mmol/L (ref 3.5–5.2)
Sodium: 139 mmol/L (ref 134–144)
Total Protein: 6.9 g/dL (ref 6.0–8.5)
eGFR: 89 mL/min/{1.73_m2} (ref >59)

## 2023-04-10 LAB — CBC WITH DIFFERENTIAL/PLATELET
Basophils Absolute: 0 10*3/uL (ref 0.0–0.2)
Basos: 1 %
EOS (ABSOLUTE): 0.2 10*3/uL (ref 0.0–0.4)
Eos: 2 %
Hematocrit: 46.5 % (ref 34.0–46.6)
Hemoglobin: 15.9 g/dL (ref 11.1–15.9)
Immature Grans (Abs): 0 10*3/uL (ref 0.0–0.1)
Immature Granulocytes: 0 %
Lymphocytes Absolute: 1.6 10*3/uL (ref 0.7–3.1)
Lymphs: 26 %
MCH: 30.4 pg (ref 26.6–33.0)
MCHC: 34.2 g/dL (ref 31.5–35.7)
MCV: 89 fL (ref 79–97)
Monocytes Absolute: 0.5 10*3/uL (ref 0.1–0.9)
Monocytes: 9 %
Neutrophils Absolute: 3.9 10*3/uL (ref 1.4–7.0)
Neutrophils: 62 %
Platelets: 278 10*3/uL (ref 150–450)
RBC: 5.23 x10E6/uL (ref 3.77–5.28)
RDW: 12.1 % (ref 11.7–15.4)
WBC: 6.2 10*3/uL (ref 3.4–10.8)

## 2023-04-10 LAB — LIPID PANEL
Chol/HDL Ratio: 3.4 ratio (ref 0.0–4.4)
Cholesterol, Total: 246 mg/dL — ABNORMAL HIGH (ref 100–199)
HDL: 72 mg/dL (ref >39)
LDL Chol Calc (NIH): 160 mg/dL — ABNORMAL HIGH (ref 0–99)
Triglycerides: 83 mg/dL (ref 0–149)
VLDL Cholesterol Cal: 14 mg/dL (ref 5–40)

## 2023-04-10 LAB — TSH RFX ON ABNORMAL TO FREE T4: TSH: 1.72 u[IU]/mL (ref 0.450–4.500)

## 2023-04-13 ENCOUNTER — Encounter (HOSPITAL_BASED_OUTPATIENT_CLINIC_OR_DEPARTMENT_OTHER): Payer: Self-pay | Admitting: Family Medicine

## 2023-05-02 ENCOUNTER — Encounter (HOSPITAL_BASED_OUTPATIENT_CLINIC_OR_DEPARTMENT_OTHER): Payer: Medicare Other | Admitting: Family Medicine

## 2023-05-02 ENCOUNTER — Encounter (HOSPITAL_BASED_OUTPATIENT_CLINIC_OR_DEPARTMENT_OTHER): Payer: Self-pay | Admitting: Family Medicine

## 2023-05-02 ENCOUNTER — Ambulatory Visit (INDEPENDENT_AMBULATORY_CARE_PROVIDER_SITE_OTHER): Admitting: Family Medicine

## 2023-05-02 VITALS — BP 108/72 | HR 51 | Ht 63.5 in | Wt 133.0 lb

## 2023-05-02 DIAGNOSIS — E559 Vitamin D deficiency, unspecified: Secondary | ICD-10-CM | POA: Diagnosis not present

## 2023-05-02 DIAGNOSIS — E785 Hyperlipidemia, unspecified: Secondary | ICD-10-CM

## 2023-05-02 DIAGNOSIS — D751 Secondary polycythemia: Secondary | ICD-10-CM | POA: Diagnosis not present

## 2023-05-02 NOTE — Assessment & Plan Note (Signed)
 Found to have elevated RBC and hemoglobin on prior labs - recent labs with normal RBC and HGB. Can continue with monitoring. If increases noted in the future, can consider referral to hematology.

## 2023-05-02 NOTE — Assessment & Plan Note (Addendum)
 Still with elevation in total cholesterol and LDL despite lifestyle modifications. She did have CAC scoring which was 0. Given this, recommend continuing with lifestyle modifications and we can continue holding off on medication.  Would plan to recheck CAC scoring in the next 5 years or so for reassessment.

## 2023-05-02 NOTE — Assessment & Plan Note (Signed)
 Normal on prior labs.  She continues with vitamin D supplementation.  We can plan to recheck this shortly before next appointment in about 6 months

## 2023-05-02 NOTE — Progress Notes (Signed)
    Procedures performed today:    None.  Independent interpretation of notes and tests performed by another provider:   None.  Brief History, Exam, Impression, and Recommendations:    BP 108/72 (BP Location: Left Arm, Patient Position: Sitting, Cuff Size: Normal)   Pulse (!) 51   Ht 5' 3.5" (1.613 m)   Wt 133 lb (60.3 kg)   SpO2 100%   BMI 23.19 kg/m   Erythrocytosis Assessment & Plan: Found to have elevated RBC and hemoglobin on prior labs - recent labs with normal RBC and HGB. Can continue with monitoring. If increases noted in the future, can consider referral to hematology.   Hyperlipidemia, unspecified hyperlipidemia type Assessment & Plan: Still with elevation in total cholesterol and LDL despite lifestyle modifications. She did have CAC scoring which was 0. Given this, recommend continuing with lifestyle modifications and we can continue holding off on medication.  Would plan to recheck CAC scoring in the next 5 years or so for reassessment.   Vitamin D deficiency Assessment & Plan: Normal on prior labs.  She continues with vitamin D supplementation.  We can plan to recheck this shortly before next appointment in about 6 months  Orders: -     VITAMIN D 25 Hydroxy (Vit-D Deficiency, Fractures); Future  Return in about 6 months (around 11/01/2023) for hyperlipidemia, vitamin D.   ___________________________________________ Carla Gorsline de [place], MD, ABFM, [facility] Primary Care and Sports Medicine [facility]

## 2023-05-02 NOTE — Patient Instructions (Signed)
  Medication Instructions:  Your physician recommends that you continue on your current medications as directed. Please refer to the Current Medication list given to you today. --If you need a refill on any your medications before your next appointment, please call your pharmacy first. If no refills are authorized on file call the office.-- Lab Work: Your physician has recommended that you have lab work today: 1 week before next visit If you have labs (blood work) drawn today and your tests are completely normal, you will receive your results via MyChart message OR a phone call from our staff.  Please ensure you check your voicemail in the event that you authorized detailed messages to be left on a delegated number. If you have any lab test that is abnormal or we need to change your treatment, we will call you to review the results.    Follow-Up: Your next appointment:   Your physician recommends that you schedule a follow-up appointment in: 6 month follow up  with Dr. de [place]  You will receive a text message or e-mail with a link to a survey about your care and experience with [place] today! We would greatly appreciate your feedback!   Thanks for letting us be apart of your health journey!!  Primary Care and Sports Medicine   Dr. Ceasar Mons [place]   We encourage you to activate your patient portal called "MyChart".  Sign up information is provided on this After Visit Summary.  MyChart is used to connect with patients for Virtual Visits (Telemedicine).  Patients are able to view lab/test results, encounter notes, upcoming appointments, etc.  Non-urgent messages can be sent to your provider as well. To learn more about what you can do with MyChart, please visit --  [email or web address].

## 2023-05-28 ENCOUNTER — Ambulatory Visit: Payer: Self-pay

## 2023-05-28 NOTE — Telephone Encounter (Signed)
 Appt made 5/6 with de [place]

## 2023-05-28 NOTE — Telephone Encounter (Signed)
 Copied From CRM [id number]. Reason for Triage: Pt called in because having knee pain, pt believes might have knee bursitis, wants to know if should go to ortho or PCP. Pt says she will wake up in the morning and have to ice it until the pain is dull enogh for her to handle.      Chief Complaint: Left Knee Symptoms: pain, slight swelling near kneecap Frequency: 4 days Pertinent Negatives: Patient denies injuries, chest pain, difficulty breathing, fever, calf pain Disposition: [] ED /[x] Urgent Care (no appt availability in office) / [] Appointment(In office/virtual)/ []  [place] [facility]/ [] Home Care/ [] Refused Recommended Disposition /[] [place] [facility]/ []  Follow-up with PCP Additional Notes: Patient advised that her left knee has been hurting and swelling some in the past 4 days.  She denies any injuries.  She states the swelling is around the kneecap. She states after sitting this knee gets stiff. Patient denies injuries, chest pain, difficulty breathing, fever, calf pain. Patient has some leftover Meloxicam  from previous arthritis if her PCP would advise her on taking that. Patient was wondering about possibly getting a cortisone injection in this knee. Patient states that she is going out of town this Wednesday around 11:00 am.  She also advised that she tried her Orthopedic provider's office but they didn't have anything available. Patient is advised that if anything worsens or she wants to be seen sooner Urgent Cares are options and also the Emergency Room if anything gets worse. Patient verbalized understanding.  This RN also called the CAL but no answer.  Will call after 1pm. To see about cortisone injections by patient's provider.  Reason for Disposition  [1] MODERATE pain (e.g., interferes with normal activities, limping) AND [2] present > 3 days  Answer Assessment - Initial Assessment Questions 1. LOCATION and RADIATION: "Where is the pain located?"      Top of kneecap of  left knee 2. QUALITY: "What does the pain feel like?"  (e.g., sharp, dull, aching, burning)     stiffness 3. SEVERITY: "How bad is the pain?" "What does it keep you from doing?"   (Scale 1-10; or mild, moderate, severe)   -  MILD (1-3): doesn't interfere with normal activities    -  MODERATE (4-7): interferes with normal activities (e.g., work or school) or awakens from sleep, limping    -  SEVERE (8-10): excruciating pain, unable to do any normal activities, unable to walk     5 4. ONSET: "When did the pain start?" "Does it come and go, or is it there all the time?"     4 days ago 5. RECURRENT: "Have you had this pain before?" If Yes, ask: "When, and what happened then?"     Slightly similar a long time that required clean up surgery that was a buckling event 6. SETTING: "Has there been any recent work, exercise or other activity that involved that part of the body?"      Daily use 7. AGGRAVATING FACTORS: "What makes the knee pain worse?" (e.g., walking, climbing stairs, running)     Sitting too long makes it worse   walking loosens it up 8. ASSOCIATED SYMPTOMS: "Is there any swelling or redness of the knee?"     Some swelling especially after walks 9. OTHER SYMPTOMS: "Do you have any other symptoms?" (e.g., chest pain, difficulty breathing, fever, calf pain)     Patient denies  Protocols used: Knee Pain-A-AH

## 2023-05-29 ENCOUNTER — Ambulatory Visit (HOSPITAL_BASED_OUTPATIENT_CLINIC_OR_DEPARTMENT_OTHER): Admitting: Family Medicine

## 2023-05-29 ENCOUNTER — Encounter (HOSPITAL_BASED_OUTPATIENT_CLINIC_OR_DEPARTMENT_OTHER): Payer: Self-pay | Admitting: Family Medicine

## 2023-05-29 VITALS — BP 100/67 | HR 60 | Ht 64.0 in | Wt 132.6 lb

## 2023-05-29 DIAGNOSIS — M1712 Unilateral primary osteoarthritis, left knee: Secondary | ICD-10-CM | POA: Diagnosis not present

## 2023-05-29 MED ORDER — MELOXICAM 7.5 MG PO TABS: 7.5000 mg | ORAL_TABLET | Freq: Every day | ORAL | 1 refills | Status: AC

## 2023-05-29 NOTE — Progress Notes (Signed)
    Procedures performed today:    None.  Independent interpretation of notes and tests performed by another provider:   None.  Brief History, Exam, Impression, and Recommendations:    BP 100/67 (BP Location: Right Arm, Patient Position: Sitting, Cuff Size: Normal)   Pulse 60   Ht 5\' 4"  (1.626 m)   Wt 132 lb 9.6 oz (60.1 kg)   SpO2 100%   BMI 22.76 kg/m   Primary osteoarthritis of left knee Assessment & Plan: History of left knee osteoarthritis.  She reports that she has had increased symptoms primarily related to stiffness.  She notes that after waking up in the morning or prolonged sitting, she will need to walk and swing her leg in order to get moving more freely/normally.  Generally has not had significant pain.  Denies any instability.  She has previously received steroid injections as well as gel injections.  Relief has been minimal with steroid injections, has done better with gel injections in the past. On exam today, no significant effusion or swelling noted.  No tenderness to palpation about the joint.  Normal ambulation in office today. We discussed options and patient would like to arrange for viscosupplementation.  Will look to obtain approval through her insurance.  If not able to obtain insurance approval, she indicates that she has picked up prescription for gel injection previously and had this brought to clinic where procedure was performed.  If wanting to proceed with this option, would be reasonable consideration Patient with osteoarthritis of the knee joints bilaterally. Radiographs show evidence of joint space narrowing, osteophytes, subchondral sclerosis and/or subchondral cysts.  This patient has knee pain which interferes with functional and activities of daily living.   This patient has experienced inadequate response, adverse effects and/or intolerance with conservative treatments such as acetaminophen, NSAIDS, topical creams, physical therapy or regular  exercise, knee bracing and/or weight loss.  This patient has experienced inadequate response or has a contraindication to intra articular steroid injections for at least 3 months.  Given continued symptoms despite measures including therapy/home exercise program, intra-articular steroid injections, NSAIDs, topical creams, we discussed viscosupplementation as additional consideration, patient interested in this. We will look to obtain authorization for viscosupplementation related to knee osteoarthritis.    Other orders -     Meloxicam ; Take 1 tablet (7.5 mg total) by mouth daily.  Dispense: 30 tablet; Refill: 1  Return if symptoms worsen or fail to improve.   ___________________________________________ Curley Hogen de Peru, MD, ABFM, CAQSM Primary Care and Sports Medicine [facility]

## 2023-05-29 NOTE — Patient Instructions (Signed)
  Medication Instructions:  Your physician recommends that you continue on your current medications as directed. Please refer to the Current Medication list given to you today. --If you need a refill on any your medications before your next appointment, please call your pharmacy first. If no refills are authorized on file call the office.--   Follow-Up: Your next appointment:   Your physician recommends that you schedule a follow-up appointment in: once we hear back from ortho visc approval  with Dr. de Peru  You will receive a text message or e-mail with a link to a survey about your care and experience with us  today! We would greatly appreciate your feedback!   Thanks for letting us  be apart of your health journey!!  Primary Care and Sports Medicine   Dr. Court Distance Peru   We encourage you to activate your patient portal called "MyChart".  Sign up information is provided on this After Visit Summary.  MyChart is used to connect with patients for Virtual Visits (Telemedicine).  Patients are able to view lab/test results, encounter notes, upcoming appointments, etc.  Non-urgent messages can be sent to your provider as well. To learn more about what you can do with MyChart, please visit --  [email or web address].

## 2023-05-29 NOTE — Assessment & Plan Note (Addendum)
 History of left knee osteoarthritis.  She reports that she has had increased symptoms primarily related to stiffness.  She notes that after waking up in the morning or prolonged sitting, she will need to walk and swing her leg in order to get moving more freely/normally.  Generally has not had significant pain.  Denies any instability.  She has previously received steroid injections as well as gel injections.  Relief has been minimal with steroid injections, has done better with gel injections in the past. On exam today, no significant effusion or swelling noted.  No tenderness to palpation about the joint.  Normal ambulation in office today. We discussed options and patient would like to arrange for viscosupplementation.  Will look to obtain approval through her insurance.  If not able to obtain insurance approval, she indicates that she has picked up prescription for gel injection previously and had this brought to clinic where procedure was performed.  If wanting to proceed with this option, would be reasonable consideration Patient with osteoarthritis of the knee joints bilaterally. Radiographs show evidence of joint space narrowing, osteophytes, subchondral sclerosis and/or subchondral cysts.  This patient has knee pain which interferes with functional and activities of daily living.   This patient has experienced inadequate response, adverse effects and/or intolerance with conservative treatments such as acetaminophen, NSAIDS, topical creams, physical therapy or regular exercise, knee bracing and/or weight loss.  This patient has experienced inadequate response or has a contraindication to intra articular steroid injections for at least 3 months.  Given continued symptoms despite measures including therapy/home exercise program, intra-articular steroid injections, NSAIDs, topical creams, we discussed viscosupplementation as additional consideration, patient interested in this. We will look to obtain  authorization for viscosupplementation related to knee osteoarthritis.

## 2023-10-15 ENCOUNTER — Ambulatory Visit (HOSPITAL_BASED_OUTPATIENT_CLINIC_OR_DEPARTMENT_OTHER): Admitting: Family Medicine

## 2023-10-18 ENCOUNTER — Encounter (HOSPITAL_BASED_OUTPATIENT_CLINIC_OR_DEPARTMENT_OTHER): Payer: Self-pay | Admitting: Family Medicine

## 2023-10-18 ENCOUNTER — Other Ambulatory Visit (HOSPITAL_BASED_OUTPATIENT_CLINIC_OR_DEPARTMENT_OTHER): Payer: Self-pay | Admitting: *Deleted

## 2023-10-18 DIAGNOSIS — E559 Vitamin D deficiency, unspecified: Secondary | ICD-10-CM

## 2023-10-19 ENCOUNTER — Telehealth (HOSPITAL_BASED_OUTPATIENT_CLINIC_OR_DEPARTMENT_OTHER): Payer: Self-pay | Admitting: *Deleted

## 2023-10-19 NOTE — Telephone Encounter (Signed)
 Copied from CRM #[id number]. Topic: General - Other >> Oct 19, 2023  8:15 AM Carla Aguilar wrote: Reason for CRM: Patient states that she read on medicare's website the AWV is not required. Is inquiring if it is required by the office to be seen. If not she will cancel it.   Patient can be reached at [phone]

## 2023-10-19 NOTE — Telephone Encounter (Signed)
 Please let her know this is not mandatory and she can cancel if she wishes

## 2023-10-22 ENCOUNTER — Encounter (HOSPITAL_BASED_OUTPATIENT_CLINIC_OR_DEPARTMENT_OTHER): Payer: Self-pay | Admitting: Family Medicine

## 2023-10-22 ENCOUNTER — Ambulatory Visit (INDEPENDENT_AMBULATORY_CARE_PROVIDER_SITE_OTHER): Admitting: Family Medicine

## 2023-10-22 VITALS — BP 112/72 | HR 65 | Ht 63.0 in | Wt 128.3 lb

## 2023-10-22 DIAGNOSIS — E785 Hyperlipidemia, unspecified: Secondary | ICD-10-CM | POA: Diagnosis not present

## 2023-10-22 DIAGNOSIS — E559 Vitamin D deficiency, unspecified: Secondary | ICD-10-CM

## 2023-10-22 DIAGNOSIS — Z23 Encounter for immunization: Secondary | ICD-10-CM | POA: Diagnosis not present

## 2023-10-22 NOTE — Assessment & Plan Note (Signed)
 Has been well-controlled previously, due for recheck of vitamin D  level today.  She continues with vitamin D  supplement, currently taking 2000 international units daily. We will check vitamin D  level today and recommend changes to dose of vitamin D  supplement accordingly

## 2023-10-22 NOTE — Assessment & Plan Note (Signed)
 Still with elevation in total cholesterol and LDL despite lifestyle modifications. She did have CAC scoring which was 0. Given this, recommend continuing with lifestyle modifications and we can continue holding off on medication.  Would plan to recheck CAC scoring in the next 5 years or so for reassessment.

## 2023-10-22 NOTE — Progress Notes (Signed)
    Procedures performed today:    None.  Independent interpretation of notes and tests performed by another provider:   None.  Brief History, Exam, Impression, and Recommendations:    BP 112/72 (BP Location: Left Arm, Patient Position: Sitting, Cuff Size: Normal)   Pulse 65   Ht 5' 3 (1.6 m)   Wt 128 lb 4.8 oz (58.2 kg)   SpO2 100%   BMI 22.73 kg/m   Hyperlipidemia, unspecified hyperlipidemia type Assessment & Plan: Still with elevation in total cholesterol and LDL despite lifestyle modifications. She did have CAC scoring which was 0. Given this, recommend continuing with lifestyle modifications and we can continue holding off on medication.  Would plan to recheck CAC scoring in the next 5 years or so for reassessment.   Encounter for immunization -     Flu vaccine trivalent PF, 6mos and older(Flulaval,Afluria,Fluarix,Fluzone)  Vitamin D  deficiency Assessment & Plan: Has been well-controlled previously, due for recheck of vitamin D  level today.  She continues with vitamin D  supplement, currently taking 2000 international units daily. We will check vitamin D  level today and recommend changes to dose of vitamin D  supplement accordingly  Orders: -     Calcium -     VITAMIN D  25 Hydroxy (Vit-D Deficiency, Fractures)  Return in about 6 months (around 04/20/2024) for CPE with fasting labs 1 week prior.   ___________________________________________ Jenai Scaletta de Peru, MD, ABFM, CAQSM Primary Care and Sports Medicine [facility]

## 2023-10-22 NOTE — Addendum Note (Signed)
 Addended by: HERMINE LATUS R on: 10/22/2023 01:55 PM   Modules accepted: Orders

## 2023-10-22 NOTE — Patient Instructions (Signed)
  Medication Instructions:  Your physician recommends that you continue on your current medications as directed. Please refer to the Current Medication list given to you today. --If you need a refill on any your medications before your next appointment, please call your pharmacy first. If no refills are authorized on file call the office.-- Lab Work: Your physician has recommended that you have lab work today: today If you have labs (blood work) drawn today and your tests are completely normal, you will receive your results via MyChart message OR a phone call from our staff.  Please ensure you check your voicemail in the event that you authorized detailed messages to be left on a delegated number. If you have any lab test that is abnormal or we need to change your treatment, we will call you to review the results.    Follow-Up: Your next appointment:   Your physician recommends that you schedule a follow-up appointment in: 6 month physical  with Dr. de Peru  You will receive a text message or e-mail with a link to a survey about your care and experience with us  today! We would greatly appreciate your feedback!   Thanks for letting us  be apart of your health journey!!  Primary Care and Sports Medicine   Dr. Court Distance Peru   We encourage you to activate your patient portal called MyChart.  Sign up information is provided on this After Visit Summary.  MyChart is used to connect with patients for Virtual Visits (Telemedicine).  Patients are able to view lab/test results, encounter notes, upcoming appointments, etc.  Non-urgent messages can be sent to your provider as well. To learn more about what you can do with MyChart, please visit --  [email or web address].

## 2023-10-23 ENCOUNTER — Ambulatory Visit (HOSPITAL_BASED_OUTPATIENT_CLINIC_OR_DEPARTMENT_OTHER)

## 2023-10-23 ENCOUNTER — Ambulatory Visit (HOSPITAL_BASED_OUTPATIENT_CLINIC_OR_DEPARTMENT_OTHER): Payer: Self-pay | Admitting: Family Medicine

## 2023-10-23 LAB — CALCIUM: Calcium: 9.7 mg/dL (ref 8.7–10.3)

## 2023-10-23 LAB — VITAMIN D 25 HYDROXY (VIT D DEFICIENCY, FRACTURES): Vit D, 25-Hydroxy: 41.4 ng/mL (ref 30.0–100.0)

## 2023-11-28 ENCOUNTER — Ambulatory Visit (HOSPITAL_BASED_OUTPATIENT_CLINIC_OR_DEPARTMENT_OTHER): Admitting: Family Medicine

## 2024-01-14 ENCOUNTER — Telehealth (HOSPITAL_BASED_OUTPATIENT_CLINIC_OR_DEPARTMENT_OTHER): Payer: Self-pay | Admitting: Family Medicine

## 2024-01-14 NOTE — Telephone Encounter (Signed)
 Copied from CRM #[id number]. Topic: Medicare AWV >> Jan 14, 2024  3:10 PM Nathanel DEL wrote: Called LVM 01/14/2024 to sched AWVS. Please schedule AWVS in office only  Nathanel Paschal; Care Guide Ambulatory Clinical Support [place] [facility] Direct Dial: [phone]

## 2024-02-12 ENCOUNTER — Encounter (HOSPITAL_BASED_OUTPATIENT_CLINIC_OR_DEPARTMENT_OTHER): Payer: Self-pay

## 2024-02-12 ENCOUNTER — Ambulatory Visit (HOSPITAL_BASED_OUTPATIENT_CLINIC_OR_DEPARTMENT_OTHER)

## 2024-02-12 VITALS — Ht 63.0 in | Wt 128.0 lb

## 2024-02-12 DIAGNOSIS — Z0000 Encounter for general adult medical examination without abnormal findings: Secondary | ICD-10-CM

## 2024-02-12 NOTE — Patient Instructions (Addendum)
 Carla Aguilar,  Thank you for taking the time for your Medicare Wellness Visit. I appreciate your continued commitment to your health goals. Please review the care plan we discussed, and feel free to reach out if I can assist you further.  Please note that Annual Wellness Visits do not include a physical exam. Some assessments may be limited, especially if the visit was conducted virtually. If needed, we may recommend an in-person follow-up with your provider.  Ongoing Care Seeing your primary care provider every 3 to 6 months helps us  monitor your health and provide consistent, personalized care. Next office visit on 04/21/2024.  You are due for a 2nd pneumonia vaccine, please discuss with provider during your next office visit.    Referrals If a referral was made during today's visit and you haven't received any updates within two weeks, please contact the referred provider directly to check on the status.  Recommended Screenings:  Health Maintenance  Topic Date Due   Zoster (Shingles) Vaccine (1 of 2) Never done   COVID-19 Vaccine (2 - Janssen risk series) 05/04/2019   Pneumococcal Vaccine for age over 64 (2 of 2 - PCV) 02/25/2022   Medicare Annual Wellness Visit  08/28/2023   Cologuard (Stool DNA test)  05/16/2024   Breast Cancer Screening  12/25/2024   DTaP/Tdap/Td vaccine (3 - Td or Tdap) 11/09/2032   Flu Shot  Completed   Osteoporosis screening with Bone Density Scan  Completed   Hepatitis C Screening  Completed   Meningitis B Vaccine  Aged Out       02/08/2024   11:36 AM  Advanced Directives  Does Patient Have a Medical Advance Directive? Yes  Type of Estate Agent of [place];Living will  Copy of Healthcare Power of Attorney in Chart? No - copy requested    Vision: Annual vision screenings are recommended for early detection of glaucoma, cataracts, and diabetic retinopathy. These exams can also reveal signs of chronic conditions such as diabetes and  high blood pressure.  Dental: Annual dental screenings help detect early signs of oral cancer, gum disease, and other conditions linked to overall health, including heart disease and diabetes.  Please see the attached documents for additional preventive care recommendations.

## 2024-02-12 NOTE — Progress Notes (Signed)
 "  Chief Complaint  Patient presents with   Medicare Wellness     Subjective:   Carla Aguilar is a 69 y.o. female who presents for a Medicare Annual Wellness Visit.  Visit info / Clinical Intake: Persons participating in visit and providing information:: patient Medicare Wellness Visit Mode:: Video Since this visit was completed virtually, some vitals may be partially provided or unavailable. Missing vitals are due to the limitations of the virtual format.: Unable to obtain vitals - no equipment If Telephone or Video please confirm:: I connected with patient using audio/video enable telemedicine. I verified patient identity with two identifiers, discussed telehealth limitations, and patient agreed to proceed. Patient Location:: home Provider Location:: office Interpreter Needed?: No Pre-visit prep was completed: yes AWV questionnaire completed by patient prior to visit?: yes Date:: 02/08/24 Living arrangements:: (Patient-Rptd) lives with spouse/significant other Patient's Overall Health Status Rating: (Patient-Rptd) very good Typical amount of pain: (Patient-Rptd) some Does pain affect daily life?: (Patient-Rptd) no Are you currently prescribed opioids?: no  Dietary Habits and Nutritional Risks How many meals a day?: (Patient-Rptd) 3 Eats fruit and vegetables daily?: (Patient-Rptd) yes Most meals are obtained by: (Patient-Rptd) preparing own meals In the last 2 weeks, have you had any of the following?: none Diabetic:: no  Functional Status Activities of Daily Living (to include ambulation/medication): (Patient-Rptd) Independent Ambulation: Independent with device- listed below Home Assistive Devices/Equipment: Eyeglasses Medication Administration: (Patient-Rptd) Independent Home Management (perform basic housework or laundry): (Patient-Rptd) Independent Manage your own finances?: (Patient-Rptd) yes Primary transportation is: (Patient-Rptd) driving Concerns about vision?:  no *vision screening is required for WTM* Concerns about hearing?: no  Fall Screening Falls in the past year?: (Patient-Rptd) 0 Number of falls in past year: 0 Was there an injury with Fall?: 0 Fall Risk Category Calculator: 0 Patient Fall Risk Level: Low Fall Risk  Fall Risk Patient at Risk for Falls Due to: No Fall Risks Fall risk Follow up: Falls evaluation completed; Falls prevention discussed  Home and Transportation Safety: All rugs have non-skid backing?: (Patient-Rptd) yes All stairs or steps have railings?: (Patient-Rptd) yes Grab bars in the bathtub or shower?: (!) (Patient-Rptd) no Have non-skid surface in bathtub or shower?: (Patient-Rptd) yes Good home lighting?: (Patient-Rptd) yes Regular seat belt use?: (Patient-Rptd) yes Hospital stays in the last year:: (Patient-Rptd) no  Cognitive Assessment Difficulty concentrating, remembering, or making decisions? : (Patient-Rptd) no Will 6CIT or Mini Cog be Completed: no 6CIT or Mini Cog Declined: patient alert, oriented, able to answer questions appropriately and recall recent events  Advance Directives (For Healthcare) Does Patient Have a Medical Advance Directive?: Yes Type of Advance Directive: Healthcare Power of [place]; Living will Copy of Healthcare Power of Attorney in Chart?: No - copy requested Copy of Living Will in Chart?: No - copy requested  Reviewed/Updated  Reviewed/Updated: Reviewed All (Medical, Surgical, Family, Medications, Allergies, Care Teams, Patient Goals)    Allergies (verified) Sulfa antibiotics and Sulfasalazine   Current Medications (verified) Outpatient Encounter Medications as of 02/12/2024  Medication Sig   Calcium 250 MG CAPS    Cholecalciferol (VITAMIN D ) 50 MCG (2000 UT) CAPS Take 1 tablet by mouth daily at 6 (six) AM.   fluocinonide ointment (LIDEX) 0.05 % Apply 1 application topically 2 (two) times daily.   Magnesium Glycinate 665 MG CAPS Take 1 capsule by mouth daily.    Multiple Vitamins-Minerals (MULTIPLE VITAMINS/WOMENS PO) Take by mouth. Nature Made   Theanine 100 MG CAPS Take 1 tablet by mouth daily at 6 (six) AM.  triamcinolone  cream (KENALOG ) 0.1 % Apply 1 Application topically 2 (two) times daily.   meloxicam  (MOBIC ) 7.5 MG tablet Take 1 tablet (7.5 mg total) by mouth daily.   No facility-administered encounter medications on file as of 02/12/2024.    History: Past Medical History:  Diagnosis Date   Anxiety    Arthritis    Hyperlipidemia 10/30/2022   History reviewed. No pertinent surgical history. Family History  Problem Relation Age of Onset   Heart disease Mother    Hypertension Mother    Arrhythmia Mother    Arthritis Mother    Kidney disease Mother    Cirrhosis Father    Kidney failure Father    Diabetes Father    Kidney disease Father    Arthritis Sister    Arrhythmia Sister    Arrhythmia Maternal Uncle    Arthritis Maternal Uncle    Heart disease Maternal Grandfather    Arthritis Paternal Uncle    Heart disease Maternal Uncle    Cancer Neg Hx    Social History   Occupational History   Occupation: Retired  Tobacco Use   Smoking status: Never    Passive exposure: Never   Smokeless tobacco: Never  Vaping Use   Vaping status: Never Used  Substance and Sexual Activity   Alcohol use: Yes    Alcohol/week: 14.0 standard drinks of alcohol    Types: 14 Glasses of wine per week   Drug use: Never   Sexual activity: Yes    Birth control/protection: None   Tobacco Counseling Counseling given: Not Answered  SDOH Screenings   Food Insecurity: No Food Insecurity (02/08/2024)  Housing: Low Risk (02/08/2024)  Transportation Needs: No Transportation Needs (02/08/2024)  Utilities: Not At Risk (02/12/2024)  Alcohol Screen: Low Risk (02/08/2024)  Depression (PHQ2-9): Low Risk (02/12/2024)  Financial Resource Strain: Low Risk (02/08/2024)  Physical Activity: Sufficiently Active (02/08/2024)  Social Connections: Unknown (02/08/2024)   Stress: No Stress Concern Present (02/08/2024)  Tobacco Use: Low Risk (02/12/2024)  Health Literacy: Adequate Health Literacy (02/12/2024)   See flowsheets for full screening details  Depression Screen PHQ 2 & 9 Depression Scale- Over the past 2 weeks, how often have you been bothered by any of the following problems? Little interest or pleasure in doing things: 0 Feeling down, depressed, or hopeless (PHQ Adolescent also includes...irritable): 0 PHQ-2 Total Score: 0 Trouble falling or staying asleep, or sleeping too much: 0 Feeling tired or having little energy: 0 Poor appetite or overeating (PHQ Adolescent also includes...weight loss): 0 Feeling bad about yourself - or that you are a failure or have let yourself or your family down: 0 Trouble concentrating on things, such as reading the newspaper or watching television (PHQ Adolescent also includes...like school work): 0 Moving or speaking so slowly that other people could have noticed. Or the opposite - being so fidgety or restless that you have been moving around a lot more than usual: 0 Thoughts that you would be better off dead, or of hurting yourself in some way: 0 PHQ-9 Total Score: 0 If you checked off any problems, how difficult have these problems made it for you to do your work, take care of things at home, or get along with other people?: Not difficult at all  Depression Treatment Depression Interventions/Treatment : EYV7-0 Score <4 Follow-up Not Indicated     Goals Addressed               This Visit's Progress     Patient Stated (pt-stated)  Not at this time/2026             Objective:    Today's Vitals   02/12/24 1112  Weight: 128 lb (58.1 kg)  Height: 5' 3 (1.6 m)   Body mass index is 22.67 kg/m.  Hearing/Vision screen Hearing Screening - Comments:: Denies hearing difficulties   Vision Screening - Comments:: Wears eyeglasses/ [facility]/Dr. Joshua Immunizations and Health  Maintenance Health Maintenance  Topic Date Due   Zoster Vaccines- Shingrix (1 of 2) Never done   COVID-19 Vaccine (2 - Janssen risk series) 05/04/2019   Pneumococcal Vaccine: 50+ Years (2 of 2 - PCV) 02/25/2022   Fecal DNA (Cologuard)  05/16/2024   Mammogram  12/25/2024   Medicare Annual Wellness (AWV)  02/11/2025   DTaP/Tdap/Td (3 - Td or Tdap) 11/09/2032   Influenza Vaccine  Completed   Bone Density Scan  Completed   Hepatitis C Screening  Completed   Meningococcal B Vaccine  Aged Out        Assessment/Plan:  This is a routine wellness examination for Patt.  Patient Care Team: de [place], Quintin PARAS, MD as PCP - General (Family Medicine) McAmmond, Jordan M, PT as Physical Therapist (Physical Therapy) Sharl Selinda Dover, MD as Consulting Physician (Orthopedic Surgery) Group, [facility]  I have personally reviewed and noted the following in the patients chart:   Medical and social history Use of alcohol, tobacco or illicit drugs  Current medications and supplements including opioid prescriptions. Functional ability and status Nutritional status Physical activity Advanced directives List of other physicians Hospitalizations, surgeries, and ER visits in previous 12 months Vitals Screenings to include cognitive, depression, and falls Referrals and appointments  No orders of the defined types were placed in this encounter.  In addition, I have reviewed and discussed with patient certain preventive protocols, quality metrics, and best practice recommendations. A written personalized care plan for preventive services as well as general preventive health recommendations were provided to patient.   Suhaylah Wampole L Caleb Decock, CMA   02/12/2024   Return in 1 year (on 02/11/2025).  After Visit Summary: (MyChart) Due to this being a telephonic visit, the after visit summary with patients personalized plan was offered to patient via MyChart   Nurse Notes: Patient would like to discuss  pneumonia vaccine during her next office visit with provider, as she is due for 2nd one. Patient declines the Shingrix vaccines. She stated that she will be bring a copy of her mammogram from last year in to make copies during her office visit. Patient had no other concerns to address today.   "

## 2024-04-21 ENCOUNTER — Ambulatory Visit (HOSPITAL_BASED_OUTPATIENT_CLINIC_OR_DEPARTMENT_OTHER): Admitting: Family Medicine
